# Patient Record
Sex: Female | Born: 1985 | Race: Black or African American | Hispanic: No | Marital: Married | Smoking: Never smoker
Health system: Southern US, Community
[De-identification: ages and names within clinical notes are randomized; demographics above are authoritative.]

## PROBLEM LIST (undated history)

## (undated) DIAGNOSIS — F419 Anxiety disorder, unspecified: Secondary | ICD-10-CM

## (undated) DIAGNOSIS — R7303 Prediabetes: Secondary | ICD-10-CM

## (undated) DIAGNOSIS — G4730 Sleep apnea, unspecified: Secondary | ICD-10-CM

## (undated) DIAGNOSIS — R5383 Other fatigue: Secondary | ICD-10-CM

## (undated) DIAGNOSIS — J189 Pneumonia, unspecified organism: Secondary | ICD-10-CM

## (undated) DIAGNOSIS — I10 Essential (primary) hypertension: Secondary | ICD-10-CM

## (undated) DIAGNOSIS — L509 Urticaria, unspecified: Secondary | ICD-10-CM

## (undated) DIAGNOSIS — R0602 Shortness of breath: Secondary | ICD-10-CM

## (undated) DIAGNOSIS — F32A Depression, unspecified: Secondary | ICD-10-CM

## (undated) HISTORY — DX: Depression, unspecified: F32.A

## (undated) HISTORY — DX: Urticaria, unspecified: L50.9

## (undated) HISTORY — DX: Prediabetes: R73.03

## (undated) HISTORY — DX: Other fatigue: R53.83

## (undated) HISTORY — DX: Anxiety disorder, unspecified: F41.9

## (undated) HISTORY — DX: Shortness of breath: R06.02

## (undated) HISTORY — DX: Sleep apnea, unspecified: G47.30

---

## 2007-10-22 ENCOUNTER — Emergency Department (HOSPITAL_COMMUNITY): Admission: EM | Admit: 2007-10-22 | Discharge: 2007-10-23 | Payer: Self-pay | Admitting: Emergency Medicine

## 2007-11-15 ENCOUNTER — Emergency Department (HOSPITAL_COMMUNITY): Admission: EM | Admit: 2007-11-15 | Discharge: 2007-11-15 | Payer: Self-pay | Admitting: Emergency Medicine

## 2008-04-11 ENCOUNTER — Emergency Department (HOSPITAL_COMMUNITY): Admission: EM | Admit: 2008-04-11 | Discharge: 2008-04-11 | Payer: Self-pay | Admitting: Emergency Medicine

## 2008-04-12 ENCOUNTER — Ambulatory Visit (HOSPITAL_COMMUNITY): Admission: RE | Admit: 2008-04-12 | Discharge: 2008-04-12 | Payer: Self-pay | Admitting: Obstetrics

## 2008-04-24 ENCOUNTER — Ambulatory Visit (HOSPITAL_COMMUNITY): Admission: RE | Admit: 2008-04-24 | Discharge: 2008-04-24 | Payer: Self-pay | Admitting: Obstetrics

## 2008-04-29 ENCOUNTER — Inpatient Hospital Stay (HOSPITAL_COMMUNITY): Admission: AD | Admit: 2008-04-29 | Discharge: 2008-05-01 | Payer: Self-pay | Admitting: Obstetrics & Gynecology

## 2008-06-25 ENCOUNTER — Ambulatory Visit (HOSPITAL_COMMUNITY): Admission: RE | Admit: 2008-06-25 | Discharge: 2008-06-25 | Payer: Self-pay | Admitting: Obstetrics

## 2008-09-06 ENCOUNTER — Inpatient Hospital Stay (HOSPITAL_COMMUNITY): Admission: AD | Admit: 2008-09-06 | Discharge: 2008-09-08 | Payer: Self-pay | Admitting: Obstetrics

## 2011-03-02 NOTE — H&P (Signed)
NAMEMosie Ponce             ACCOUNT NO.:  [id number]   MEDICAL RECORD NO.:  [id number]          PATIENT TYPE:  INP   LOCATION:  9163                          [facility]ILITY:  [facility]   PHYSICIAN:  Roseanna Rainbow, M.D.DATE OF BIRTH:  May 26, 1986   DATE OF ADMISSION:  09/06/2008  DATE OF DISCHARGE:                              HISTORY & PHYSICAL   CHIEF COMPLAINT:  The patient is a 25 year old para 0 with an estimated  date of confinement of September 07, 2008, with an intrauterine pregnancy  at 39+ weeks with elevated blood pressure.   HISTORY OF PRESENT ILLNESS:  The patient had presented to the office  earlier in the week and was found to have elevated blood pressures in  the 140s over 80s-90s.  She denies any neurological complaints.   ALLERGIES:  No known drug allergies.   MEDICATIONS:  Please see the medication reconciliation form.   PRENATAL LABORATORY DATA:  Chlamydia probe is negative.  Urine culture  and sensitivity no uropathogens.  GC probe negative.  One-hour GTT 101.  Hepatitis B surface antigen negative, hematocrit 32.1, hemoglobin 10.6,  HIV nonreactive, platelets 350,000.  Blood type B+, antibody screen  negative, RPR nonreactive, and rubella immune.  Sickle cell negative.   PAST GYNECOLOGICAL HISTORY:  Abnormal Pap smear status post colposcopy.   PAST MEDICAL HISTORY:  No significant history of medical diseases.   PAST SURGICAL HISTORY:  No previous surgery.   SOCIAL HISTORY:  She is a Consulting civil engineer.  She is single, has no significant  smoking history.  Denies illicit drug use.   FAMILY HISTORY:  Remarkable for chronic hypertension, adult-onset  diabetes, and CVA.   PHYSICAL EXAMINATION:  VITAL SIGNS:  Stable, afebrile.  Blood pressures  is 128/60.  Fetal heart tracing reassuring.  Tocodynamometer rare  uterine contractions.  GENERAL:  Overweight African American female in no apparent distress.  ABDOMEN:  Gravid.  Sterile vaginal exam.   LABORATORY DATA:  A  PIH panel in September 05, 2008, was unremarkable.   ASSESSMENT:  Primigravida at term with rule out gestational hypertension  with fetal heart tracing consistent with fetal well-being. borderline  Bishop score.   PLAN:  Admission, 2-stage induction of labor.  Monitor for signs and  symptoms of worsening preeclampsia.      Roseanna Rainbow, M.D.  Electronically Signed     LAJ/MEDQ  D:  09/06/2008  T:  09/07/2008  Job:  [id number]

## 2011-03-02 NOTE — H&P (Signed)
NAMEMosie Ponce             ACCOUNT NO.:  [id number]   MEDICAL RECORD NO.:  [id number]          PATIENT TYPE:  INP   LOCATION:  9319                          [facility]ILITY[facility]  [facility]   PHYSICIAN:  Roseanna Rainbow, M.D.DATE OF BIRTH:  10-12-1986   DATE OF ADMISSION:  04/29/2008  DATE OF DISCHARGE:  05/01/2008                              HISTORY & PHYSICAL   CHIEF COMPLAINT:  The patient is a 25 year old with complaints of nausea  and vomiting.   HISTORY OF PRESENT ILLNESS:  The patient had been treated previously  with Phenergan for similar complaints.  She reports being unable to  tolerate fluids.   PAST MEDICAL HISTORY:  Noncontributory.   PAST SURGICAL HISTORY:  She denies.   SOCIAL HISTORY:  No tobacco, ethanol or drug use.   REVIEW OF SYSTEMS:  GI:  Please see the above.   MEDICATIONS:  Please see the medication reconciliation form.   ALLERGIES:  No known drug allergies.   PHYSICAL EXAMINATION:  VITAL SIGNS:  Stable, afebrile.  ABDOMEN:  Nontender.  PELVIC:  Deferred.  Fetal heart tones by Doppler 160s.   LABS:  Urinalysis, specific gravity greater than 1.030, cloudy, small  bilirubin, 40 ketones, 30 protein, potassium 3.3, white blood cell count  5.8, hemoglobin 10.4, and hematocrit 30.1.   ASSESSMENT:  Intrauterine pregnancy at 21 weeks with pregnancy related  nausea, vomiting, mild-to-moderate dehydration and mild hypokalemia.   PLAN:  Admission, IV hydration, and antiemetics.      Roseanna Rainbow, M.D.  Electronically Signed     LAJ/MEDQ  D:  05/01/2008  T:  05/01/2008  Job:  [id number]

## 2011-03-05 NOTE — Discharge Summary (Signed)
NAMEMosie Epstein             ACCOUNT NO.:  [id number]   MEDICAL RECORD NO.:  [id number]          PATIENT TYPE:  INP   LOCATION:  9319                          [facility]  [facility]   PHYSICIAN:  Roseanna Rainbow, M.D.DATE OF BIRTH:  1986/01/27   DATE OF ADMISSION:  04/29/2008  DATE OF DISCHARGE:  05/01/2008                               DISCHARGE SUMMARY   CHIEF COMPLAINT:  The patient is a 25 year old with an intrauterine  pregnancy at 21+ weeks complaining of nausea and vomiting.  Please see  the dictated history and physical.   HISTORY OF PRESENT ILLNESS:  The patient was admitted and started on  antiemetics.  She was also hydrated with intravenous crystalloid.  She  was started on a BRATT diet and this was tolerated and her nausea  improved.  She was discharged to home on May 01, 2008.   DISCHARGE DIAGNOSES:  Pregnancy related nausea and vomiting.   CONDITION:  Improved.   DIET:  BRATT diet.   MEDICATIONS:  Zofran as needed.   ACTIVITY:  Ad lib.   DISPOSITION:  The patient was to follow up in the office in 1 week.      Roseanna Rainbow, M.D.  Electronically Signed     LAJ/MEDQ  D:  05/31/2008  T:  06/01/2008  Job:  [id number]

## 2011-07-08 LAB — URINALYSIS, ROUTINE W REFLEX MICROSCOPIC
Bilirubin Urine: NEGATIVE
Glucose, UA: NEGATIVE
Hgb urine dipstick: NEGATIVE
Specific Gravity, Urine: 1.031 — ABNORMAL HIGH
pH: 7

## 2011-07-08 LAB — BASIC METABOLIC PANEL
Calcium: 9.1
GFR calc non Af Amer: >60
Potassium: 3.5
Sodium: 138

## 2011-07-08 LAB — DIFFERENTIAL
Eosinophils Relative: 3
Lymphocytes Relative: 16
Lymphs Abs: 2.3
Monocytes Absolute: 0.8
Neutro Abs: 10.1 — ABNORMAL HIGH

## 2011-07-08 LAB — HEPATIC FUNCTION PANEL
ALT: 13
AST: 13
Alkaline Phosphatase: 67
Total Protein: 7.6

## 2011-07-08 LAB — CBC
HCT: 35.1 — ABNORMAL LOW
Hemoglobin: 11.8 — ABNORMAL LOW
Platelets: 426 — ABNORMAL HIGH
WBC: 13.8 — ABNORMAL HIGH

## 2011-07-08 LAB — URINE MICROSCOPIC-ADD ON

## 2011-07-15 LAB — CBC
HCT: 30.1 — ABNORMAL LOW
Platelets: 278
RBC: 3.41 — ABNORMAL LOW

## 2011-07-15 LAB — URINALYSIS, ROUTINE W REFLEX MICROSCOPIC
Bilirubin Urine: NEGATIVE
Glucose, UA: NEGATIVE
Hgb urine dipstick: NEGATIVE
Hgb urine dipstick: NEGATIVE
Leukocytes, UA: NEGATIVE
Nitrite: NEGATIVE
Protein, ur: 30 — AB
Specific Gravity, Urine: 1.027
Specific Gravity, Urine: >1.03 — ABNORMAL HIGH
Urobilinogen, UA: 1
Urobilinogen, UA: 1

## 2011-07-15 LAB — COMPREHENSIVE METABOLIC PANEL
Albumin: 2.8 — ABNORMAL LOW
Alkaline Phosphatase: 40
BUN: 3 — ABNORMAL LOW
Chloride: 106
Glucose, Bld: 85
Potassium: 3.3 — ABNORMAL LOW
Total Bilirubin: 0.7

## 2011-07-15 LAB — URINE MICROSCOPIC-ADD ON

## 2011-07-20 LAB — CBC
HCT: 25.4 — ABNORMAL LOW
MCHC: 33.7
Platelets: 251
Platelets: 298
RBC: 3.54 — ABNORMAL LOW
WBC: 10.2

## 2011-07-20 LAB — RPR: RPR Ser Ql: NONREACTIVE

## 2012-09-09 ENCOUNTER — Emergency Department (HOSPITAL_COMMUNITY)
Admission: EM | Admit: 2012-09-09 | Discharge: 2012-09-09 | Disposition: A | Discharge status: Home / Self Care | Payer: Medicaid Other | Source: Home / Self Care | Attending: Family Medicine | Admitting: Family Medicine

## 2012-09-09 ENCOUNTER — Encounter (HOSPITAL_COMMUNITY): Payer: Self-pay

## 2012-09-09 DIAGNOSIS — R51 Headache: Secondary | ICD-10-CM

## 2012-09-09 DIAGNOSIS — J309 Allergic rhinitis, unspecified: Secondary | ICD-10-CM

## 2012-09-09 HISTORY — DX: Essential (primary) hypertension: I10

## 2012-09-09 LAB — POCT URINALYSIS DIP (DEVICE)
Glucose, UA: NEGATIVE mg/dL
Hgb urine dipstick: NEGATIVE
Protein, ur: NEGATIVE mg/dL
Specific Gravity, Urine: 1.02 (ref 1.005–1.030)
Urobilinogen, UA: 1 mg/dL (ref 0.0–1.0)
pH: 7 (ref 5.0–8.0)

## 2012-09-09 LAB — POCT PREGNANCY, URINE: Preg Test, Ur: NEGATIVE

## 2012-09-09 MED ORDER — TRAMADOL HCL 50 MG PO TABS: 50.0000 mg | ORAL_TABLET | Freq: Four times a day (QID) | ORAL | Status: DC | PRN

## 2012-09-09 MED ORDER — PREDNISONE 20 MG PO TABS: ORAL_TABLET | ORAL | Status: DC

## 2012-09-09 MED ORDER — CETIRIZINE-PSEUDOEPHEDRINE ER 5-120 MG PO TB12: 1.0000 | ORAL_TABLET | Freq: Two times a day (BID) | ORAL | Status: DC

## 2012-09-09 MED ORDER — IBUPROFEN 600 MG PO TABS: 600.0000 mg | ORAL_TABLET | Freq: Three times a day (TID) | ORAL | Status: DC | PRN

## 2012-09-09 NOTE — ED Notes (Signed)
States she has been having HA off and on forpast few days, using OTC medications w minimal relief; NAD at present

## 2012-09-12 NOTE — ED Provider Notes (Signed)
History     CSN: [id number]  Arrival date & time 09/09/12  1440   First MD Initiated Contact with Patient 09/09/12 1559      Chief Complaint  Patient presents with  . Headache    (Consider location/radiation/quality/duration/timing/severity/associated sxs/prior treatment) HPI Comments: 26 year old female with history of morbid obesity hypertension. Not on current medications. Here complaining of intermittent headaches for the last 3 days. Denies visual changes. Denies balance problems. Patient reports some nasal congestion and clear rhinorrhea. Denies productive cough shortness of breath or chest pain. No balance or gait problems. No extremity weakness numbness or paresthesias. No nausea or vomiting. She is sexually active and reports taking birth control pills. Denies dysuria, hematuria, fever or chills. No abdominal pain.   Past Medical History  Diagnosis Date  . Hypertension     History reviewed. No pertinent past surgical history.  History reviewed. No pertinent family history.  History  Substance Use Topics  . Smoking status: Never Smoker   . Smokeless tobacco: Not on file  . Alcohol Use: Yes    OB History    Grav Para Term Preterm Abortions TAB SAB Ect Mult Living                  Review of Systems  Constitutional: Negative for fever, chills, diaphoresis, activity change, appetite change, fatigue and unexpected weight change.  HENT: Positive for congestion, rhinorrhea, sneezing and sinus pressure. Negative for ear pain, sore throat, neck pain, neck stiffness, tinnitus and ear discharge.   Eyes: Negative for photophobia, pain, redness and visual disturbance.  Cardiovascular: Negative for chest pain, palpitations and leg swelling.  Gastrointestinal: Negative for nausea, vomiting, abdominal pain and diarrhea.  Genitourinary: Negative for dysuria, frequency, hematuria, flank pain, vaginal bleeding, vaginal discharge, vaginal pain and pelvic pain.  Musculoskeletal:  Negative for gait problem.  Neurological: Positive for headaches. Negative for dizziness, tremors, seizures, syncope, speech difficulty, weakness and numbness.    Allergies  Review of patient's allergies indicates no known allergies.  Home Medications   Current Outpatient Rx  Name  Route  Sig  Dispense  Refill  . CETIRIZINE-PSEUDOEPHEDRINE ER 5-120 MG PO TB12   Oral   Take 1 tablet by mouth 2 (two) times daily.   20 tablet   0   . IBUPROFEN 600 MG PO TABS   Oral   Take 1 tablet (600 mg total) by mouth every 8 (eight) hours as needed for pain.   20 tablet   0   . PREDNISONE 20 MG PO TABS      2 tablets by mouth daily for 5 days   10 tablet   0   . TRAMADOL HCL 50 MG PO TABS   Oral   Take 1 tablet (50 mg total) by mouth every 6 (six) hours as needed for pain.   15 tablet   0     BP 141/77  Pulse 107  Temp 98.5 F (36.9 C) (Oral)  Resp 14  SpO2 100%  LMP 09/03/2012  Physical Exam  Nursing note and vitals reviewed. Constitutional: She is oriented to person, place, and time. She appears well-developed and well-nourished. No distress.  HENT:  Head: Normocephalic and atraumatic.       Nasal Congestion with erythema and swelling of nasal turbinates, clear rhinorrhea. Significant pharyngeal erythema enlarged tonsils bilaterally no exudates. No uvula deviation. No trismus. TM's with increased vascular markings impress clear fluid behind TMs, no dullness bilaterally no swelling or bulging   Eyes: Conjunctivae  normal and EOM are normal. Pupils are equal, round, and reactive to light. Right eye exhibits no discharge. Left eye exhibits no discharge. No scleral icterus.  Neck: Neck supple. No thyromegaly present.       Obese  Cardiovascular: Normal rate, regular rhythm and normal heart sounds.  Exam reveals no gallop and no friction rub.   No murmur heard. Pulmonary/Chest: Effort normal and breath sounds normal. No respiratory distress. She has no wheezes. She has no rales.  She exhibits no tenderness.  Abdominal: Soft. Bowel sounds are normal. She exhibits no distension and no mass. There is no tenderness. There is no rebound and no guarding.  Lymphadenopathy:    She has cervical adenopathy.  Neurological: She is alert and oriented to person, place, and time.  Skin: No rash noted. She is not diaphoretic.    ED Course  Procedures (including critical care time)   Labs Reviewed  POCT INFECTIOUS MONO SCREEN  POCT RAPID STREP A ([facility] CARE ONLY)  POCT URINALYSIS DIP (DEVICE)  GLUCOSE, CAPILLARY  POCT PREGNANCY, URINE  LAB REPORT - SCANNED   No results found.   1. Headache   2. Allergic rhinitis       MDM  Normal urine test.  pregnancy test negative. Negative strep and mono test. Impress allergic rhinitis with postnasal drip likely irritating tonsils tonsils, middle ear clear effusion, no fever or exudates does not impress infectious. Blood pressure normal here. Prescribed Zyrtec-D. Prednisone and tramadol. Asked to followup with primary care provider to monitor her symptoms. Supportive care and red flags that should prompt her return to medical attention discussed with patient and provided in writing.        Sharin Grave, MD 09/12/12 1444

## 2013-04-03 ENCOUNTER — Other Ambulatory Visit: Payer: Self-pay | Admitting: Family Medicine

## 2013-04-03 ENCOUNTER — Other Ambulatory Visit (HOSPITAL_COMMUNITY)
Admission: RE | Admit: 2013-04-03 | Discharge: 2013-04-03 | Disposition: A | Discharge status: Home / Self Care | Payer: Medicaid Other | Source: Ambulatory Visit | Attending: Family Medicine | Admitting: Family Medicine

## 2013-04-03 DIAGNOSIS — N760 Acute vaginitis: Secondary | ICD-10-CM | POA: Insufficient documentation

## 2013-04-03 DIAGNOSIS — Z113 Encounter for screening for infections with a predominantly sexual mode of transmission: Secondary | ICD-10-CM | POA: Insufficient documentation

## 2014-07-08 ENCOUNTER — Ambulatory Visit: Payer: Medicaid Other | Admitting: Dietician

## 2014-08-08 ENCOUNTER — Ambulatory Visit: Payer: Medicaid Other | Admitting: Dietician

## 2014-11-27 ENCOUNTER — Other Ambulatory Visit (INDEPENDENT_AMBULATORY_CARE_PROVIDER_SITE_OTHER): Payer: Self-pay | Admitting: General Surgery

## 2014-11-27 DIAGNOSIS — G4733 Obstructive sleep apnea (adult) (pediatric): Secondary | ICD-10-CM

## 2014-12-17 ENCOUNTER — Ambulatory Visit (HOSPITAL_COMMUNITY)
Admission: RE | Admit: 2014-12-17 | Discharge: 2014-12-17 | Disposition: A | Discharge status: Home / Self Care | Payer: 59 | Source: Ambulatory Visit | Attending: General Surgery | Admitting: General Surgery

## 2014-12-17 ENCOUNTER — Other Ambulatory Visit: Payer: Self-pay

## 2014-12-17 ENCOUNTER — Encounter (HOSPITAL_COMMUNITY)
Admission: RE | Disposition: A | Discharge status: Home / Self Care | Payer: Self-pay | Source: Ambulatory Visit | Attending: General Surgery

## 2014-12-17 DIAGNOSIS — R05 Cough: Secondary | ICD-10-CM | POA: Diagnosis not present

## 2014-12-17 DIAGNOSIS — G4733 Obstructive sleep apnea (adult) (pediatric): Secondary | ICD-10-CM

## 2014-12-17 HISTORY — PX: BREATH TEK H PYLORI: SHX5422

## 2014-12-17 SURGERY — BREATH TEST, FOR HELICOBACTER PYLORI

## 2014-12-17 NOTE — Progress Notes (Signed)
   12/17/14 1026  BREATH TEK ASSESSMENT  Referring MD Dr Gaynelle AduEric Wilson  Time of Last PO Intake 2200  Baseline Breath At: 0729  Pranactin Given At: 0749  Post-Dose Breath At: 0744  Sample 1 3.7%  Sample 2 2.7%  Test Negative

## 2014-12-18 ENCOUNTER — Encounter (HOSPITAL_COMMUNITY): Payer: Self-pay | Admitting: General Surgery

## 2014-12-21 ENCOUNTER — Encounter: Payer: Self-pay | Admitting: Dietician

## 2014-12-21 ENCOUNTER — Encounter: Payer: 59 | Attending: General Surgery | Admitting: Dietician

## 2014-12-21 DIAGNOSIS — Z6841 Body Mass Index (BMI) 40.0 and over, adult: Secondary | ICD-10-CM | POA: Diagnosis not present

## 2014-12-21 DIAGNOSIS — Z713 Dietary counseling and surveillance: Secondary | ICD-10-CM | POA: Diagnosis not present

## 2014-12-21 NOTE — Patient Instructions (Signed)
Follow Pre-Op Goals Try Protein Shakes Call [facility] at [phone] when surgery is scheduled to enroll in Pre-Op Class  Things to remember:  Please always be honest with us. We want to support you!  If you have any questions or concerns in between appointments, please call or email Liz, Dayona Shaheen, or Laurie.  The diet after surgery will be high protein and low in carbohydrate.  Vitamins and calcium need to be taken for the rest of your life.  Feel free to include support people in any classes or appointments.  

## 2014-12-21 NOTE — Progress Notes (Signed)
  Pre-Op Assessment Visit:  Pre-Operative Gastric sleeve Surgery  Medical Nutrition Therapy:  Appt start time: 0805   End time:  0840  Patient was seen on 12/21/2014 for Pre-Operative Nutrition Assessment. [facility] coordinator on 12/21/2014.   Preferred Learning Style:   No preference indicated   Learning Readiness:   Ready  Handouts given during visit include:  Pre-Op Goals Bariatric Surgery Protein Shakes   During the appointment today the following Pre-Op Goals were reviewed with the patient: Maintain or lose weight as instructed by your surgeon Make healthy food choices Begin to limit portion sizes Limited concentrated sugars and fried foods Keep fat/sugar in the single digits per serving on   food labels Practice CHEWING your food  (aim for 30 chews per bite or until applesauce consistency) Practice not drinking 15 minutes before, during, and 30 minutes after each meal/snack Avoid all carbonated beverages  Avoid/limit caffeinated beverages  Avoid all sugar-sweetened beverages Consume 3 meals per day; eat every 3-5 hours Make a list of non-food related activities Aim for 64-100 ounces of FLUID daily  Aim for at least 60-80 grams of PROTEIN daily Look for a liquid protein source that contain ?15 g protein and ?5 g carbohydrate  (ex: shakes, drinks, shots)  Patient-Centered Goals: -More energy -Travel with daughter  Scale of 1-10: confidence (6) / importance scale (10)  Demonstrated degree of understanding via:  Teach Back  Teaching Method Utilized:  Visual Auditory Hands on  Barriers to learning/adherence to lifestyle change: none  Patient to call the [facility] to enroll in Pre-Op and Post-Op Nutrition Education when surgery date is scheduled.

## 2014-12-31 ENCOUNTER — Other Ambulatory Visit (HOSPITAL_COMMUNITY)
Admission: RE | Admit: 2014-12-31 | Discharge: 2014-12-31 | Disposition: A | Discharge status: Home / Self Care | Payer: 59 | Source: Ambulatory Visit | Attending: Obstetrics and Gynecology | Admitting: Obstetrics and Gynecology

## 2014-12-31 ENCOUNTER — Other Ambulatory Visit: Payer: Self-pay | Admitting: Nurse Practitioner

## 2014-12-31 DIAGNOSIS — Z01419 Encounter for gynecological examination (general) (routine) without abnormal findings: Secondary | ICD-10-CM | POA: Diagnosis not present

## 2014-12-31 DIAGNOSIS — Z113 Encounter for screening for infections with a predominantly sexual mode of transmission: Secondary | ICD-10-CM | POA: Diagnosis present

## 2015-01-01 LAB — CYTOLOGY - PAP

## 2015-01-11 ENCOUNTER — Encounter (HOSPITAL_COMMUNITY): Payer: Self-pay | Admitting: *Deleted

## 2015-01-11 ENCOUNTER — Emergency Department (INDEPENDENT_AMBULATORY_CARE_PROVIDER_SITE_OTHER)
Admission: EM | Admit: 2015-01-11 | Discharge: 2015-01-11 | Disposition: A | Discharge status: Home / Self Care | Payer: 59 | Source: Home / Self Care | Attending: Family Medicine | Admitting: Family Medicine

## 2015-01-11 DIAGNOSIS — L739 Follicular disorder, unspecified: Secondary | ICD-10-CM | POA: Diagnosis not present

## 2015-01-11 MED ORDER — DOXYCYCLINE HYCLATE 100 MG PO CAPS: 100.0000 mg | ORAL_CAPSULE | Freq: Two times a day (BID) | ORAL | Status: DC

## 2015-01-11 NOTE — ED Provider Notes (Signed)
CSN: [id number]     Arrival date & time 01/11/15  1714 History   First MD Initiated Contact with Patient 01/11/15 1741     Chief Complaint  Patient presents with  . Neck Pain   (Consider location/radiation/quality/duration/timing/severity/associated sxs/prior Treatment) Patient is a 29 y.o. female presenting with neck pain. The history is provided by the patient.  Neck Pain Pain location:  Occipital region Quality:  Stabbing Pain radiates to:  Does not radiate Pain severity:  Mild Onset quality:  Gradual Duration:  3 weeks Progression:  Worsening (over past 2 days) Chronicity:  New Relieved by:  None tried Worsened by:  Nothing tried Associated symptoms: no fever, no numbness and no tingling     Past Medical History  Diagnosis Date  . Hypertension   . Sleep apnea    Past Surgical History  Procedure Laterality Date  . Breath tek h pylori N/A 12/17/2014    Procedure: BREATH TEK H PYLORI;  Surgeon: Atilano InaEric M Wilson, MD;  [facility];  Service: General;  Laterality: N/A;   History reviewed. No pertinent family history. History  Substance Use Topics  . Smoking status: Never Smoker   . Smokeless tobacco: Not on file  . Alcohol Use: Yes   OB History    No data available     Review of Systems  Constitutional: Negative.  Negative for fever.  HENT: Negative.   Musculoskeletal: Positive for neck pain. Negative for neck stiffness.  Neurological: Negative for tingling and numbness.    Allergies  Review of patient's allergies indicates no known allergies.  Home Medications   Prior to Admission medications   Medication Sig Start Date End Date Taking? Authorizing Provider  cetirizine-pseudoephedrine (ZYRTEC-D) 5-120 MG per tablet Take 1 tablet by mouth 2 (two) times daily. Patient not taking: Reported on 12/21/2014 09/09/12   Christin FudgeAdlih Moreno-Coll, MD  doxycycline (VIBRAMYCIN) 100 MG capsule Take 1 capsule (100 mg total) by mouth 2 (two) times daily. 01/11/15   Linna HoffJames D  Kindl, MD  ibuprofen (ADVIL,MOTRIN) 600 MG tablet Take 1 tablet (600 mg total) by mouth every 8 (eight) hours as needed for pain. Patient not taking: Reported on 12/21/2014 09/09/12   Christin FudgeAdlih Moreno-Coll, MD  norethindrone-ethinyl estradiol (JUNEL FE,GILDESS FE,LOESTRIN FE) 1-20 MG-MCG tablet Take 1 tablet by mouth daily.    Historical Provider, MD  predniSONE (DELTASONE) 20 MG tablet 2 tablets by mouth daily for 5 days Patient not taking: Reported on 12/21/2014 09/09/12   Christin FudgeAdlih Moreno-Coll, MD  traMADol (ULTRAM) 50 MG tablet Take 1 tablet (50 mg total) by mouth every 6 (six) hours as needed for pain. Patient not taking: Reported on 12/21/2014 09/09/12   Adlih Moreno-Coll, MD   BP 111/81 mmHg  Pulse 108  Temp(Src) 98.5 F (36.9 C) (Oral)  Resp 16  SpO2 96%  LMP 11/18/2014 (Approximate) Physical Exam  Constitutional: She is oriented to person, place, and time. She appears well-developed and well-nourished.  Neck: Normal range of motion. Neck supple.  Musculoskeletal: She exhibits tenderness.  Lymphadenopathy:    She has no cervical adenopathy.  Neurological: She is alert and oriented to person, place, and time.  Skin: Skin is warm and dry.  Tender midline midoccipital neck cystic nodule, no erythema or drainage.  Nursing note and vitals reviewed.   ED Course  Procedures (including critical care time) Labs Review Labs Reviewed - No data to display  Imaging Review No results found.   MDM   1. Acute folliculitis  Linna Hoff, MD 01/11/15 [phone]

## 2015-01-11 NOTE — ED Notes (Signed)
Pt  Noticed   Some  Swelling    To  Back of  Neck  sev  Weeks  Ago  She  Reports  It  Got  painfull  2  Days  Ago     it  Is  Tender to the  Touch  -  She  denys  Any  Known  specefic injury

## 2015-01-11 NOTE — Discharge Instructions (Signed)
Warm compress twice a day when you take the antibiotic, take all of medicine, return as needed. °

## 2015-01-20 ENCOUNTER — Encounter: Payer: 59 | Attending: General Surgery | Admitting: Dietician

## 2015-01-20 DIAGNOSIS — Z713 Dietary counseling and surveillance: Secondary | ICD-10-CM | POA: Insufficient documentation

## 2015-01-20 DIAGNOSIS — Z6841 Body Mass Index (BMI) 40.0 and over, adult: Secondary | ICD-10-CM | POA: Diagnosis not present

## 2015-01-20 NOTE — Progress Notes (Signed)
  Supervised Weight Loss:  Appt start time: 440 end time:  455  SWL visit 1:  Primary concerns today: Lydia returns having gained 5 pounds since her assessment 1 month ago. She reports struggling to eliminate caffeine. Has cut out sodas but still having some tea. Having water with Mio drops. Has been out of town lately and had to eat out more. Has recently started eating breakfast regularly.  Weight: 316.1 lbs BMI: 59.9  Goals: -Keep working on reducing caffeine intake -Work on being mindful of bariatric-friendly options at [employer] food restaurants  MEDICATIONS: see list  DIETARY INTAKE:  24-hr recall:  B ( AM): bacon and scrambled eggs  Snk ( AM) :  L ( PM): normally out: burger and fries  Snk ( PM):  D ( PM): fish and shrimp, french fries  Snk ( PM):   Beverages: tea, water with flavoring  Recent physical activity: did not assess today  Estimated energy needs: 1600-1800 calories  Progress Towards Goal(s):  In progress.   Nutritional Diagnosis:  [place]-3.3 Overweight/obesity related to past poor dietary habits and physical inactivity as evidenced by patient in SWL for pending bariatric surgery following dietary guidelines for continued weight loss.     Intervention:  Nutrition counseling provided.  Handouts given during visit include:  Bariatric Fast Food Guide  Monitoring/Evaluation:  Dietary intake, exercise, and body weight in 4 week(s).

## 2015-02-22 ENCOUNTER — Encounter: Payer: Self-pay | Admitting: Dietician

## 2015-02-22 ENCOUNTER — Encounter: Payer: 59 | Attending: General Surgery | Admitting: Dietician

## 2015-02-22 DIAGNOSIS — Z6841 Body Mass Index (BMI) 40.0 and over, adult: Secondary | ICD-10-CM | POA: Diagnosis not present

## 2015-02-22 DIAGNOSIS — Z713 Dietary counseling and surveillance: Secondary | ICD-10-CM | POA: Insufficient documentation

## 2015-02-22 NOTE — Progress Notes (Signed)
  Supervised Weight Loss:  Appt start time: 800 end time:  815  SWL visit 2:  Primary concerns today: Laura Ponce returns having lost 12 pounds. She states that she has started drinking only water, rarely tea. She has succeeded at eliminating caffeine. Laura Ponce is also trying to eat 3 meals a day. She has been using Bariatric Fast Food Guide handout to practice being mindful of bariatric-friendly choices. Also working on stopping when she's full. Still struggling with cutting back on carbs.   Weight: 303.9 lbs BMI: 57.5  Goals: -Practice chewing thoroughly, taking small bites, and eating slowly -Keep working on being mindful of bariatric-friendly options at fast food restaurants  MEDICATIONS: see list  DIETARY INTAKE:  24-hr recall:  B ( AM): bacon and scrambled eggs  Snk ( AM) :  L ( PM): normally out: burger and fries  Snk ( PM):  D ( PM): fish and shrimp, french fries  Snk ( PM):   Beverages: tea, water with flavoring  Recent physical activity: did not assess today  Estimated energy needs: 1600-1800 calories  Progress Towards Goal(s):  In progress.   Nutritional Diagnosis:  [place]-3.3 Overweight/obesity related to past poor dietary habits and physical inactivity as evidenced by patient in SWL for pending bariatric surgery following dietary guidelines for continued weight loss.     Intervention:  Nutrition counseling provided.   Monitoring/Evaluation:  Dietary intake, exercise, and body weight in 4 week(s).

## 2015-03-22 ENCOUNTER — Encounter: Payer: 59 | Attending: General Surgery | Admitting: Dietician

## 2015-03-22 ENCOUNTER — Encounter: Payer: Self-pay | Admitting: Dietician

## 2015-03-22 DIAGNOSIS — Z713 Dietary counseling and surveillance: Secondary | ICD-10-CM | POA: Insufficient documentation

## 2015-03-22 DIAGNOSIS — Z6841 Body Mass Index (BMI) 40.0 and over, adult: Secondary | ICD-10-CM | POA: Diagnosis not present

## 2015-03-22 NOTE — Patient Instructions (Addendum)
Goals: -Practice chewing thoroughly, taking small bites, and eating slowly -Try Premier Protein  -Plan to exercise 2 x week (walking) for at least 30 minutes -Practice not drinking 15 minutes before eat, during meals, and up through 30 minutes after eating

## 2015-03-22 NOTE — Progress Notes (Signed)
  Supervised Weight Loss:  Appt start time: 900 end time:  915  SWL visit 3:  Primary concerns today: Maize returns having gained 3 lbs. She started doing an hcG drop diet and has been eating a lot of pasta and sweets. Will not be continuing this diet. Still working on finding a protein shake she likes. Is looking for a gym and not regularly exercising.   Weight: 306.0 lbs BMI: 57.8  Goals: -Practice chewing thoroughly, taking small bites, and eating slowly -Try Premier Protein  -Plan to exercise 2 x week (walking) for at least 30 minutes -Practice not drinking 15 minutes before eat, during meals, and up through 30 minutes after eating  MEDICATIONS: see list  DIETARY INTAKE:  24-hr recall:  B ( AM): bacon and scrambled eggs  Snk ( AM) :  L ( PM): normally out: burger and fries  Snk ( PM):  D ( PM): fish and shrimp, french fries  Snk ( PM):   Beverages: tea, water with flavoring  Recent physical activity: none  Estimated energy needs: 1600-1800 calories  Progress Towards Goal(s):  In progress.   Nutritional Diagnosis:  [place]-3.3 Overweight/obesity related to past poor dietary habits and physical inactivity as evidenced by patient in SWL for pending bariatric surgery following dietary guidelines for continued weight loss.     Intervention:  Nutrition counseling provided.   Monitoring/Evaluation:  Dietary intake, exercise, and body weight in 4 week(s).

## 2015-04-26 ENCOUNTER — Encounter: Payer: Self-pay | Admitting: Dietician

## 2015-04-26 ENCOUNTER — Encounter: Payer: 59 | Attending: General Surgery | Admitting: Dietician

## 2015-04-26 DIAGNOSIS — Z6841 Body Mass Index (BMI) 40.0 and over, adult: Secondary | ICD-10-CM | POA: Diagnosis not present

## 2015-04-26 DIAGNOSIS — Z713 Dietary counseling and surveillance: Secondary | ICD-10-CM | POA: Insufficient documentation

## 2015-04-26 NOTE — Patient Instructions (Addendum)
Goals: -Practice chewing thoroughly, taking small bites, and eating slowly -Plan to exercise 2 x week (walking) for at least 30 minutes -Practice not drinking 15 minutes before eat, during meals, and up through 30 minutes after eating -Work on avoiding fried foods  [email or web address]

## 2015-04-26 NOTE — Progress Notes (Signed)
  Supervised Weight Loss:  Appt start time: 805 end time:  820  SWL visit 4:  Primary concerns today: Laura Ponce returns having lost about a half a pound. She has been out of town on vacation for 2 weeks and has not been eating well. However, she has been working on not drinking while she is eating. She finds herself getting "choked" sometimes. She signed up for a gym membership at [employer] but has not been. She tried chocolate Premier protein shake and did not like it. She has decided to [facility] She finds herself having stomach cramps and having to use the bathroom after eating; she suspects this is due to increase in fried foods.  Weight: 305.5 lbs BMI: 57.8  Goals: -Practice chewing thoroughly, taking small bites, and eating slowly -Plan to exercise 2 x week (walking) for at least 30 minutes -Practice not drinking 15 minutes before eat, during meals, and up through 30 minutes after eating -Work on avoiding fried foods  MEDICATIONS: see list  DIETARY INTAKE:  24-hr recall:  B ( AM): bacon and scrambled eggs  Snk ( AM) :  L ( PM): normally out: burger and fries  Snk ( PM):  D ( PM): fish and shrimp, french fries  Snk ( PM):   Beverages: tea, water with flavoring  Recent physical activity: none  Estimated energy needs: 1600-1800 calories  Progress Towards Goal(s):  In progress.   Nutritional Diagnosis:  [place]-3.3 Overweight/obesity related to past poor dietary habits and physical inactivity as evidenced by patient in SWL for pending bariatric surgery following dietary guidelines for continued weight loss.     Intervention:  Nutrition counseling provided.   Monitoring/Evaluation:  Dietary intake, exercise, and body weight in 4 week(s).

## 2015-05-06 ENCOUNTER — Other Ambulatory Visit (HOSPITAL_COMMUNITY): Payer: Self-pay | Admitting: Nurse Practitioner

## 2015-05-06 DIAGNOSIS — R102 Pelvic and perineal pain: Secondary | ICD-10-CM

## 2015-05-09 ENCOUNTER — Ambulatory Visit (HOSPITAL_COMMUNITY): Payer: 59

## 2015-05-13 ENCOUNTER — Ambulatory Visit (HOSPITAL_COMMUNITY): Payer: 59

## 2015-05-16 ENCOUNTER — Ambulatory Visit (HOSPITAL_COMMUNITY): Payer: 59

## 2015-05-19 ENCOUNTER — Encounter: Payer: 59 | Attending: General Surgery | Admitting: Dietician

## 2015-05-19 DIAGNOSIS — Z6841 Body Mass Index (BMI) 40.0 and over, adult: Secondary | ICD-10-CM | POA: Diagnosis not present

## 2015-05-19 DIAGNOSIS — Z713 Dietary counseling and surveillance: Secondary | ICD-10-CM | POA: Insufficient documentation

## 2015-05-19 NOTE — Progress Notes (Signed)
  Supervised Weight Loss:  Appt start time: 200 end time:  215  SWL visit 5:  Primary concerns today: Laura Ponce returns having gained 2.5 lbs. She is feeling nervous about upcoming surgery. She got the pre op diet handout from a friend who just had surgery and tried the diet for a few days. Has been drinking chocolate Premier protein shake. Also working on not drinking while eating.   Weight: 308 lbs BMI: 58.3  Goals: -Practice chewing thoroughly, taking small bites, and eating slowly -Plan to exercise 2 x week (walking) for at least 30 minutes -Practice not drinking 15 minutes before eat, during meals, and up through 30 minutes after eating -Work on avoiding fried foods  MEDICATIONS: see list  DIETARY INTAKE:  24-hr recall:  B ( AM): bacon and scrambled eggs  Snk ( AM) :  L ( PM): normally out: burger and fries  Snk ( PM):  D ( PM): fish and shrimp, french fries  Snk ( PM):   Beverages: tea, water with flavoring  Recent physical activity: none  Estimated energy needs: 1600-1800 calories  Progress Towards Goal(s):  In progress.   Nutritional Diagnosis:  [place]-3.3 Overweight/obesity related to past poor dietary habits and physical inactivity as evidenced by patient in SWL for pending bariatric surgery following dietary guidelines for continued weight loss.     Intervention:  Nutrition counseling provided.   Monitoring/Evaluation:  Dietary intake, exercise, and body weight in 4 week(s).

## 2015-05-20 ENCOUNTER — Encounter: Payer: Self-pay | Admitting: Dietician

## 2015-05-24 ENCOUNTER — Ambulatory Visit: Payer: 59 | Admitting: Dietician

## 2015-06-02 NOTE — Progress Notes (Signed)
  Pre-Operative Nutrition Class:  Appt start time: 830   End time:  930.  Patient was seen on 06/02/2015 for Pre-Operative Bariatric Surgery Education at [facility].   Surgery date:  Surgery type: Sleeve Gastrectomy Start weight at [facility]: 311 lbs on 12/21/14 Weight today: 310.5 lbs  TANITA  BODY COMP RESULTS  06/02/15   BMI (kg/m^2) 58.7   Fat Mass (lbs) 178.5   Fat Free Mass (lbs) 132   Total Body Water (lbs) 96.5   Samples given per MNT protocol. Patient educated on appropriate usage: Premier protein shake (vanilla - qty 1) Lot #: [id number] Exp: 10/2015  Celebrate calcium chew (berry - qty 1) Lot #: [id number] Exp: 12/2016  Renee Pain Protein Powder (unflavored - qty 1) Lot #: [id number] Exp: 04/2016  The following the learning objectives were met by the patient during this course:  Identify Pre-Op Dietary Goals and will begin 2 weeks pre-operatively  Identify appropriate sources of fluids and proteins   State protein recommendations and appropriate sources pre and post-operatively  Identify Post-Operative Dietary Goals and will follow for 2 weeks post-operatively  Identify appropriate multivitamin and calcium sources  Describe the need for physical activity post-operatively and will follow MD recommendations  State when to call healthcare provider regarding medication questions or post-operative complications  Handouts given during class include:  Pre-Op Bariatric Surgery Diet Handout  Protein Shake Handout  Post-Op Bariatric Surgery Nutrition Handout  BELT Program Information Flyer  Support Group Information Flyer  [facility] Bariatric Supplements Price List  Follow-Up Plan: Patient will follow-up at [facility] 2 weeks post operatively for diet advancement per MD.

## 2015-06-06 ENCOUNTER — Ambulatory Visit: Payer: Self-pay | Admitting: General Surgery

## 2015-06-12 NOTE — Patient Instructions (Signed)
Laura Ponce  06/12/2015   Your procedure is scheduled RU:[id number] 06/17/2015   Report to [facility] Main  Entrance take [place]  elevators to 3rd floor to  Short Stay Center at   1035 AM.  Call this number if you have problems the morning of surgery [phone]   Remember: ONLY 1 PERSON MAY GO WITH YOU TO SHORT STAY TO GET  READY MORNING OF YOUR SURGERY.  Do not eat food or drink liquids :After Midnight.     Take these medicines the morning of surgery with A SIP OF WATER: none                               You may not have any metal on your body including hair pins and              piercings  Do not wear jewelry, make-up, lotions, powders or perfumes, deodorant             Do not wear nail polish.  Do not shave  48 hours prior to surgery.              Men may shave face and neck.   Do not bring valuables to the hospital. [facility] IS NOT             RESPONSIBLE   FOR VALUABLES.  Contacts, dentures or bridgework may not be worn into surgery.  Leave suitcase in the car. After surgery it may be brought to your room.     Patients discharged the day of surgery will not be allowed to drive home.  Name and phone number of your driver:  Special Instructions: N/A              Please read over the following fact sheets you were given: _____________________________________________________________________             [facility] - Preparing for Surgery Before surgery, you can play an important role.  Because skin is not sterile, your skin needs to be as free of germs as possible.  You can reduce the number of germs on your skin by washing with CHG (chlorahexidine gluconate) soap before surgery.  CHG is an antiseptic cleaner which kills germs and bonds with the skin to continue killing germs even after washing. Please DO NOT use if you have an allergy to CHG or antibacterial soaps.  If your skin becomes reddened/irritated stop using the CHG and inform your nurse when  you arrive at Short Stay. Do not shave (including legs and underarms) for at least 48 hours prior to the first CHG shower.  You may shave your face/neck. Please follow these instructions carefully:  1.  Shower with CHG Soap the night before surgery and the  morning of Surgery.  2.  If you choose to wash your hair, wash your hair first as usual with your  normal  shampoo.  3.  After you shampoo, rinse your hair and body thoroughly to remove the  shampoo.                           4.  Use CHG as you would any other liquid soap.  You can apply chg directly  to the skin and wash  Gently with a scrungie or clean washcloth.  5.  Apply the CHG Soap to your body ONLY FROM THE NECK DOWN.   Do not use on face/ open                           Wound or open sores. Avoid contact with eyes, ears mouth and genitals (private parts).                       Wash face,  Genitals (private parts) with your normal soap.             6.  Wash thoroughly, paying special attention to the area where your surgery  will be performed.  7.  Thoroughly rinse your body with warm water from the neck down.  8.  DO NOT shower/wash with your normal soap after using and rinsing off  the CHG Soap.                9.  Pat yourself dry with a clean towel.            10.  Wear clean pajamas.            11.  Place clean sheets on your bed the night of your first shower and do not  sleep with pets. Day of Surgery : Do not apply any lotions/deodorants the morning of surgery.  Please wear clean clothes to the hospital/surgery center.  FAILURE TO FOLLOW THESE INSTRUCTIONS MAY RESULT IN THE CANCELLATION OF YOUR SURGERY PATIENT SIGNATURE_________________________________  NURSE SIGNATURE__________________________________  ________________________________________________________________________   Adam Phenix  An incentive spirometer is a tool that can help keep your lungs clear and active. This tool measures  how well you are filling your lungs with each breath. Taking long deep breaths may help reverse or decrease the chance of developing breathing (pulmonary) problems (especially infection) following:  A long period of time when you are unable to move or be active. BEFORE THE PROCEDURE   If the spirometer includes an indicator to show your best effort, your nurse or respiratory therapist will set it to a desired goal.  If possible, sit up straight or lean slightly forward. Try not to slouch.  Hold the incentive spirometer in an upright position. INSTRUCTIONS FOR USE  1. Sit on the edge of your bed if possible, or sit up as far as you can in bed or on a chair. 2. Hold the incentive spirometer in an upright position. 3. Breathe out normally. 4. Place the mouthpiece in your mouth and seal your lips tightly around it. 5. Breathe in slowly and as deeply as possible, raising the piston or the ball toward the top of the column. 6. Hold your breath for 3-5 seconds or for as long as possible. Allow the piston or ball to fall to the bottom of the column. 7. Remove the mouthpiece from your mouth and breathe out normally. 8. Rest for a few seconds and repeat Steps 1 through 7 at least 10 times every 1-2 hours when you are awake. Take your time and take a few normal breaths between deep breaths. 9. The spirometer may include an indicator to show your best effort. Use the indicator as a goal to work toward during each repetition. 10. After each set of 10 deep breaths, practice coughing to be sure your lungs are clear. If you have an incision (the cut made at the time of surgery),  support your incision when coughing by placing a pillow or rolled up towels firmly against it. Once you are able to get out of bed, walk around indoors and cough well. You may stop using the incentive spirometer when instructed by your caregiver.  RISKS AND COMPLICATIONS  Take your time so you do not get dizzy or light-headed.  If  you are in pain, you may need to take or ask for pain medication before doing incentive spirometry. It is harder to take a deep breath if you are having pain. AFTER USE  Rest and breathe slowly and easily.  It can be helpful to keep track of a log of your progress. Your caregiver can provide you with a simple table to help with this. If you are using the spirometer at home, follow these instructions: [place] IF:   You are having difficultly using the spirometer.  You have trouble using the spirometer as often as instructed.  Your pain medication is not giving enough relief while using the spirometer.  You develop fever of 100.5 F (38.1 C) or higher. SEEK IMMEDIATE MEDICAL CARE IF:   You cough up bloody sputum that had not been present before.  You develop fever of 102 F (38.9 C) or greater.  You develop worsening pain at or near the incision site. MAKE SURE YOU:   Understand these instructions.  Will watch your condition.  Will get help right away if you are not doing well or get worse. Document Released: 02/14/2007 Document Revised: 12/27/2011 Document Reviewed: 04/17/2007 [facility] Patient Information 2014 [place], [place].   ________________________________________________________________________

## 2015-06-13 ENCOUNTER — Ambulatory Visit: Payer: Self-pay | Admitting: General Surgery

## 2015-06-13 ENCOUNTER — Encounter (HOSPITAL_COMMUNITY)
Admission: RE | Admit: 2015-06-13 | Discharge: 2015-06-13 | Disposition: A | Discharge status: Home / Self Care | Payer: 59 | Source: Ambulatory Visit | Attending: General Surgery | Admitting: General Surgery

## 2015-06-13 ENCOUNTER — Encounter (HOSPITAL_COMMUNITY): Payer: Self-pay

## 2015-06-13 DIAGNOSIS — Z01818 Encounter for other preprocedural examination: Secondary | ICD-10-CM | POA: Insufficient documentation

## 2015-06-13 HISTORY — DX: Pneumonia, unspecified organism: J18.9

## 2015-06-13 LAB — COMPREHENSIVE METABOLIC PANEL
ALBUMIN: 4.1 g/dL (ref 3.5–5.0)
ALK PHOS: 85 U/L (ref 38–126)
ALT: 15 U/L (ref 14–54)
ANION GAP: 7 (ref 5–15)
AST: 20 U/L (ref 15–41)
BILIRUBIN TOTAL: 0.6 mg/dL (ref 0.3–1.2)
BUN: 9 mg/dL (ref 6–20)
CALCIUM: 9 mg/dL (ref 8.9–10.3)
CO2: 26 mmol/L (ref 22–32)
Chloride: 104 mmol/L (ref 101–111)
Creatinine, Ser: 0.81 mg/dL (ref 0.44–1.00)
GFR calc Af Amer: >60 mL/min (ref >60)
GLUCOSE: 83 mg/dL (ref 65–99)
Potassium: 3.6 mmol/L (ref 3.5–5.1)
Sodium: 137 mmol/L (ref 135–145)
TOTAL PROTEIN: 7.8 g/dL (ref 6.5–8.1)

## 2015-06-13 LAB — CBC WITH DIFFERENTIAL/PLATELET
BASOS PCT: 0 % (ref 0–1)
Basophils Absolute: 0 10*3/uL (ref 0.0–0.1)
Eosinophils Absolute: 0.3 10*3/uL (ref 0.0–0.7)
Eosinophils Relative: 3 % (ref 0–5)
HEMATOCRIT: 36.9 % (ref 36.0–46.0)
HEMOGLOBIN: 11.9 g/dL — AB (ref 12.0–15.0)
LYMPHS ABS: 4 10*3/uL (ref 0.7–4.0)
LYMPHS PCT: 38 % (ref 12–46)
MCH: 28 pg (ref 26.0–34.0)
MCHC: 32.2 g/dL (ref 30.0–36.0)
MCV: 86.8 fL (ref 78.0–100.0)
MONO ABS: 0.4 10*3/uL (ref 0.1–1.0)
MONOS PCT: 4 % (ref 3–12)
NEUTROS ABS: 5.7 10*3/uL (ref 1.7–7.7)
NEUTROS PCT: 55 % (ref 43–77)
Platelets: 430 10*3/uL — ABNORMAL HIGH (ref 150–400)
RBC: 4.25 MIL/uL (ref 3.87–5.11)
RDW: 13.3 % (ref 11.5–15.5)
WBC: 10.4 10*3/uL (ref 4.0–10.5)

## 2015-06-13 LAB — HCG, SERUM, QUALITATIVE: PREG SERUM: NEGATIVE

## 2015-06-13 NOTE — H&P (Signed)
Laura Ponce 06/13/2015 9:02 AM Location: [facility] Patient #: [phone] DOB: 09/13/1986 Single / Language: Laura Ponce / Race: Black or African American Female History of Present Illness Laura Ponce M. Laura Vitelli MD; 06/13/2015 9:27 AM) Patient words: bariatric.  The patient is a 29 year old female who presents for a preoperative evaluation. She has been approved for laparoscopic sleeve gastrectomy and upper endoscopy. Her surgery is currently scheduled for August 30. She denies any medical changes since I initially met her. She denies any trips to the emergency room or hospital. She is no longer taking any prescription medication. She did get fitted for CPAP and is currently using it. She denies any chest pain, chest pressure, shortness of breath, gastroesophageal reflux disease. Her preoperative workup revealed a normal upper GI and chest x-ray. Her initial evaluation labs were also normal. She had some mild anemia with a hemoglobin of 11.9, hematocrit 36.5. Problem List/Past Medical Laura Ponce Laura Derby, MD; 06/13/2015 9:27 AM) OBSTRUCTIVE SLEEP APNEA ON CPAP (327.23  G47.33) PAIN IN BOTH KNEES, UNSPECIFIED CHRONICITY (719.46  M25.561, M25.562) OBESITY, MORBID, BMI 50 OR HIGHER (278.01  E66.01)  Past Surgical History Laura Curry, MD; 06/13/2015 9:27 AM) No pertinent past surgical history  Diagnostic Studies History Laura Curry, MD; 06/13/2015 9:27 AM) Pap Smear 1-5 years ago Colonoscopy never Mammogram never  Allergies Laura Ponce, Laura Ponce; 06/13/2015 9:03 AM) No Known Drug Allergies 11/21/2014  Medication History Laura Curry, MD; 06/13/2015 9:27 AM) Laura Ponce FE 1.5/30 (1.5-30MG-MCG Tablet, Oral) Active. Medications Reconciled OxyCODONE HCl (5MG/5ML Solution, 5-10 Milliliter Oral every four hours, as needed, Taken starting 06/13/2015) Active. Zofran ODT (4MG Tablet Disperse, 1 (one) Tablet Disperse Oral every six hours, as needed, Taken starting 06/13/2015)  Active.  Social History Laura Curry, MD; 06/13/2015 9:27 AM) Tobacco use Never smoker. Caffeine use Carbonated beverages, Tea. No drug use Alcohol use Occasional alcohol use.  Family History Laura Curry, MD; 06/13/2015 9:27 AM) Bleeding disorder Mother. Hypertension Father.  Pregnancy / Birth History Laura Curry, MD; 06/13/2015 9:27 AM) Laura Ponce 3 Maternal Ponce 38-25 Contraceptive History Oral contraceptives. Ponce at menarche 10 years. Para 1 Regular periods     Review of Systems Laura Curry, MD; 06/13/2015 9:24 00) General Not Present- Appetite Loss, Chills, Fatigue, Fever, Night Sweats, Weight Gain and Weight Loss. Skin Not Present- Change in Wart/Mole, Dryness, Hives, Jaundice, New Lesions, Non-Healing Wounds, Rash and Ulcer. HEENT Not Present- Earache, Hearing Loss, Hoarseness, Nose Bleed, Oral Ulcers, Ringing in the Ears, Seasonal Allergies, Sinus Pain, Sore Throat, Visual Disturbances, Wears glasses/contact lenses and Yellow Eyes. Respiratory Present- Snoring. Not Present- Bloody sputum, Chronic Cough, Difficulty Breathing and Wheezing. Breast Not Present- Breast Mass, Breast Pain, Nipple Discharge and Skin Changes. Cardiovascular Not Present- Chest Pain, Difficulty Breathing Lying Down, Leg Cramps, Palpitations, Rapid Heart Rate, Shortness of Breath and Swelling of Extremities. Gastrointestinal Not Present- Abdominal Pain, Bloating, Bloody Stool, Change in Bowel Habits, Chronic diarrhea, Constipation, Difficulty Swallowing, Excessive gas, Gets full quickly at meals, Hemorrhoids, Indigestion, Nausea, Rectal Pain and Vomiting. Female Genitourinary Not Present- Frequency, Nocturia, Painful Urination, Pelvic Pain and Urgency. Musculoskeletal Not Present- Back Pain, Joint Pain, Joint Stiffness, Muscle Pain, Muscle Weakness and Swelling of Extremities. Neurological Not Present- Decreased Memory, Fainting, Headaches, Numbness, Seizures, Tingling, Tremor, Trouble  walking and Weakness. Psychiatric Not Present- Anxiety, Bipolar, Change in Sleep Pattern, Depression, Fearful and Frequent crying. Endocrine Not Present- Cold Intolerance, Excessive Hunger, Hair Changes, Heat Intolerance, Hot flashes and New Diabetes. Hematology Not Present- Easy  Bruising, Excessive bleeding, Gland problems, HIV and Persistent Infections.  Vitals ([name]; 06/13/2015 9:03 AM) 06/13/2015 9:02 AM Weight: 312 lb Height: 61in Body Surface Area: 2.47 m Body Mass Index: 58.95 kg/m Temp.: 25F(Temporal)  Pulse: 77 (Regular)  BP: 128/76 (Sitting, Left Arm, Standard)     Physical Exam Laura Ponce M. [name] MD; 06/13/2015 9:23 AM)  General Mental Status-Alert. General Appearance-Consistent with stated Ponce. Hydration-Well hydrated. Voice-Normal. Note: morbidly obese, pear   Head and Neck Head-normocephalic, atraumatic with no lesions or palpable masses. Trachea-midline. Thyroid Gland Characteristics - normal size and consistency.  Eye Eyeball - Bilateral-Extraocular movements intact. Sclera/Conjunctiva - Bilateral-No scleral icterus.  Chest and Lung Exam Chest and lung exam reveals -quiet, even and easy respiratory effort with no use of accessory muscles and on auscultation, normal breath sounds, no adventitious sounds and normal vocal resonance. Inspection Chest Wall - Normal. Back - normal.  Breast - Did not examine.  Cardiovascular Cardiovascular examination reveals -normal heart sounds, regular rate and rhythm with no murmurs and normal pedal pulses bilaterally.  Abdomen Inspection Inspection of the abdomen reveals - No Hernias. Skin - Scar - no surgical scars. Palpation/Percussion Palpation and Percussion of the abdomen reveal - Soft, Non Tender, No Rebound tenderness, No Rigidity (guarding) and No hepatosplenomegaly. Auscultation Auscultation of the abdomen reveals - Bowel sounds normal.  Peripheral Vascular Upper  Extremity Palpation - Pulses bilaterally normal.  Neurologic Neurologic evaluation reveals -alert and oriented x 3 with no impairment of recent or remote memory. Mental Status-Normal.  Neuropsychiatric The patient's mood and affect are described as -normal. Judgment and Insight-insight is appropriate concerning matters relevant to self.  Musculoskeletal Normal Exam - Left-Upper Extremity Strength Normal and Lower Extremity Strength Normal. Normal Exam - Right-Upper Extremity Strength Normal and Lower Extremity Strength Normal. Note: b/l knee crepitus   Lymphatic Head & Neck  General Head & Neck Lymphatics: Bilateral - Description - Normal. Axillary - Did not examine. Femoral & Inguinal - Did not examine.    Assessment & Plan Laura Ponce M. Rosalina Dingwall MD; 06/13/2015 9:27 AM)  OBESITY, MORBID, BMI 50 OR HIGHER (278.01  E66.01) Impression: We reviewed her preoperative workup. We discussed the typical hospitalization. We discussed the typical postoperative recovery course. We discussed the importance of the preoperative diet. I reminded her to bring her CPAP mask with her. She was given prescriptions for postoperative pain and nausea medications.  Current Plans Pt Education - EMW_preopbariatric Started OxyCODONE HCl 5MG/5ML, 5-10 Milliliter every four hours, as needed, 200 Milliliter, 06/13/2015, No Refill. Started Zofran ODT 4MG, 1 (one) Tablet Disperse every six hours, as needed, #30, 06/13/2015, No Refill. PAIN IN BOTH KNEES, UNSPECIFIED CHRONICITY (719.46  M25.561)  OBSTRUCTIVE SLEEP APNEA ON CPAP (327.23  G47.33)  Leighton Ruff. Redmond Pulling, MD, FACS General, Bariatric, & Minimally Invasive Surgery [facility], [place]

## 2015-06-17 ENCOUNTER — Encounter (HOSPITAL_COMMUNITY): Payer: Self-pay | Admitting: *Deleted

## 2015-06-17 ENCOUNTER — Inpatient Hospital Stay (HOSPITAL_COMMUNITY)
Admission: RE | Admit: 2015-06-17 | Discharge: 2015-06-19 | DRG: 621 | Disposition: A | Discharge status: Home / Self Care | Payer: 59 | Source: Ambulatory Visit | Attending: General Surgery | Admitting: General Surgery

## 2015-06-17 ENCOUNTER — Inpatient Hospital Stay (HOSPITAL_COMMUNITY): Payer: 59 | Admitting: Anesthesiology

## 2015-06-17 ENCOUNTER — Encounter (HOSPITAL_COMMUNITY)
Admission: RE | Disposition: A | Discharge status: Home / Self Care | Payer: Self-pay | Source: Ambulatory Visit | Attending: General Surgery

## 2015-06-17 DIAGNOSIS — Z9989 Dependence on other enabling machines and devices: Secondary | ICD-10-CM

## 2015-06-17 DIAGNOSIS — Z9884 Bariatric surgery status: Secondary | ICD-10-CM

## 2015-06-17 DIAGNOSIS — Z8249 Family history of ischemic heart disease and other diseases of the circulatory system: Secondary | ICD-10-CM

## 2015-06-17 DIAGNOSIS — Z01812 Encounter for preprocedural laboratory examination: Secondary | ICD-10-CM

## 2015-06-17 DIAGNOSIS — M25561 Pain in right knee: Secondary | ICD-10-CM | POA: Diagnosis present

## 2015-06-17 DIAGNOSIS — G4733 Obstructive sleep apnea (adult) (pediatric): Secondary | ICD-10-CM | POA: Diagnosis present

## 2015-06-17 DIAGNOSIS — G8929 Other chronic pain: Secondary | ICD-10-CM | POA: Diagnosis present

## 2015-06-17 DIAGNOSIS — Z6841 Body Mass Index (BMI) 40.0 and over, adult: Secondary | ICD-10-CM

## 2015-06-17 DIAGNOSIS — D649 Anemia, unspecified: Secondary | ICD-10-CM | POA: Diagnosis present

## 2015-06-17 DIAGNOSIS — M25562 Pain in left knee: Secondary | ICD-10-CM | POA: Diagnosis present

## 2015-06-17 HISTORY — PX: LAPAROSCOPIC GASTRIC SLEEVE RESECTION: SHX5895

## 2015-06-17 HISTORY — PX: UPPER GI ENDOSCOPY: SHX6162

## 2015-06-17 LAB — PREGNANCY, URINE: Preg Test, Ur: NEGATIVE

## 2015-06-17 LAB — HEMOGLOBIN AND HEMATOCRIT, BLOOD
HCT: 35.1 % — ABNORMAL LOW (ref 36.0–46.0)
HEMOGLOBIN: 11.5 g/dL — AB (ref 12.0–15.0)

## 2015-06-17 SURGERY — GASTRECTOMY, SLEEVE, LAPAROSCOPIC
Anesthesia: General

## 2015-06-17 MED ORDER — EPHEDRINE SULFATE 50 MG/ML IJ SOLN
INTRAMUSCULAR | Status: AC
  Filled 2015-06-17: qty 1

## 2015-06-17 MED ORDER — FENTANYL CITRATE (PF) 100 MCG/2ML IJ SOLN
INTRAMUSCULAR | Status: DC | PRN
  Administered 2015-06-17 (×2): 50 ug via INTRAVENOUS
  Administered 2015-06-17: 100 ug via INTRAVENOUS
  Administered 2015-06-17: 50 ug via INTRAVENOUS

## 2015-06-17 MED ORDER — SODIUM CHLORIDE 0.9 % IJ SOLN
INTRAMUSCULAR | Status: AC
  Filled 2015-06-17: qty 50

## 2015-06-17 MED ORDER — HYDROMORPHONE HCL 1 MG/ML IJ SOLN: 0.2500 mg | INTRAMUSCULAR | Status: DC | PRN

## 2015-06-17 MED ORDER — METOCLOPRAMIDE HCL 5 MG/ML IJ SOLN
10.0000 mg | Freq: Three times a day (TID) | INTRAMUSCULAR | Status: DC | PRN
  Administered 2015-06-17 – 2015-06-18 (×2): 10 mg via INTRAVENOUS
  Filled 2015-06-17 (×2): qty 2

## 2015-06-17 MED ORDER — CHLORHEXIDINE GLUCONATE 4 % EX LIQD: 60.0000 mL | Freq: Once | CUTANEOUS | Status: DC

## 2015-06-17 MED ORDER — KCL IN DEXTROSE-NACL 20-5-0.45 MEQ/L-%-% IV SOLN
INTRAVENOUS | Status: DC
  Administered 2015-06-17 – 2015-06-19 (×6): via INTRAVENOUS
  Filled 2015-06-17 (×8): qty 1000

## 2015-06-17 MED ORDER — ACETAMINOPHEN 10 MG/ML IV SOLN
1000.0000 mg | Freq: Four times a day (QID) | INTRAVENOUS | Status: AC
  Administered 2015-06-17 – 2015-06-18 (×4): 1000 mg via INTRAVENOUS
  Filled 2015-06-17 (×5): qty 100

## 2015-06-17 MED ORDER — DEXAMETHASONE SODIUM PHOSPHATE 4 MG/ML IJ SOLN
INTRAMUSCULAR | Status: DC | PRN
  Administered 2015-06-17: 10 mg via INTRAVENOUS

## 2015-06-17 MED ORDER — ACETAMINOPHEN 160 MG/5ML PO SOLN: 325.0000 mg | ORAL | Status: DC | PRN

## 2015-06-17 MED ORDER — NEOSTIGMINE METHYLSULFATE 10 MG/10ML IV SOLN
INTRAVENOUS | Status: DC | PRN
  Administered 2015-06-17: 5 mg via INTRAVENOUS

## 2015-06-17 MED ORDER — LACTATED RINGERS IR SOLN
Status: DC | PRN
  Administered 2015-06-17: 1000 mL

## 2015-06-17 MED ORDER — GLYCOPYRROLATE 0.2 MG/ML IJ SOLN
INTRAMUSCULAR | Status: DC | PRN
  Administered 2015-06-17: 1 mg via INTRAVENOUS

## 2015-06-17 MED ORDER — FENTANYL CITRATE (PF) 250 MCG/5ML IJ SOLN
INTRAMUSCULAR | Status: AC
  Filled 2015-06-17: qty 25

## 2015-06-17 MED ORDER — CEFOTETAN DISODIUM-DEXTROSE 2-2.08 GM-% IV SOLR
INTRAVENOUS | Status: AC
  Filled 2015-06-17: qty 50

## 2015-06-17 MED ORDER — CEFOTETAN DISODIUM-DEXTROSE 2-2.08 GM-% IV SOLR
2.0000 g | INTRAVENOUS | Status: AC
  Administered 2015-06-17: 2 g via INTRAVENOUS

## 2015-06-17 MED ORDER — 0.9 % SODIUM CHLORIDE (POUR BTL) OPTIME
TOPICAL | Status: DC | PRN
  Administered 2015-06-17: 1000 mL

## 2015-06-17 MED ORDER — DEXMEDETOMIDINE HCL IN NACL 400 MCG/100ML IV SOLN
INTRAVENOUS | Status: DC | PRN
  Administered 2015-06-17: 0.7 ug/kg/h via INTRAVENOUS

## 2015-06-17 MED ORDER — LIDOCAINE HCL (CARDIAC) 20 MG/ML IV SOLN
INTRAVENOUS | Status: AC
  Filled 2015-06-17: qty 5

## 2015-06-17 MED ORDER — PROPOFOL 10 MG/ML IV BOLUS
INTRAVENOUS | Status: AC
  Filled 2015-06-17: qty 20

## 2015-06-17 MED ORDER — UNJURY CHOCOLATE CLASSIC POWDER
2.0000 [oz_av] | Freq: Four times a day (QID) | ORAL | Status: DC
  Administered 2015-06-19: 2 [oz_av] via ORAL

## 2015-06-17 MED ORDER — UNJURY VANILLA POWDER: 2.0000 [oz_av] | Freq: Four times a day (QID) | ORAL | Status: DC

## 2015-06-17 MED ORDER — OXYCODONE HCL 5 MG PO TABS: 5.0000 mg | ORAL_TABLET | Freq: Once | ORAL | Status: DC | PRN

## 2015-06-17 MED ORDER — ONDANSETRON HCL 4 MG/2ML IJ SOLN
4.0000 mg | INTRAMUSCULAR | Status: DC | PRN
  Administered 2015-06-17 – 2015-06-19 (×7): 4 mg via INTRAVENOUS
  Filled 2015-06-17 (×7): qty 2

## 2015-06-17 MED ORDER — ROCURONIUM BROMIDE 100 MG/10ML IV SOLN
INTRAVENOUS | Status: DC | PRN
  Administered 2015-06-17: 30 mg via INTRAVENOUS
  Administered 2015-06-17: 40 mg via INTRAVENOUS

## 2015-06-17 MED ORDER — DEXMEDETOMIDINE HCL IN NACL 200 MCG/50ML IV SOLN
INTRAVENOUS | Status: DC | PRN
  Administered 2015-06-17: .7 ug/kg/h via INTRAVENOUS

## 2015-06-17 MED ORDER — MIDAZOLAM HCL 2 MG/2ML IJ SOLN
INTRAMUSCULAR | Status: AC
Start: 2015-06-17 — End: 2015-06-17
  Filled 2015-06-17: qty 4

## 2015-06-17 MED ORDER — ONDANSETRON HCL 4 MG/2ML IJ SOLN
INTRAMUSCULAR | Status: DC | PRN
  Administered 2015-06-17: 4 mg via INTRAVENOUS

## 2015-06-17 MED ORDER — EPHEDRINE SULFATE 50 MG/ML IJ SOLN
INTRAMUSCULAR | Status: DC | PRN
  Administered 2015-06-17: 10 mg via INTRAVENOUS

## 2015-06-17 MED ORDER — METOCLOPRAMIDE HCL 5 MG/ML IJ SOLN
INTRAMUSCULAR | Status: DC | PRN
  Administered 2015-06-17: 10 mg via INTRAVENOUS

## 2015-06-17 MED ORDER — OXYCODONE HCL 5 MG/5ML PO SOLN
5.0000 mg | Freq: Once | ORAL | Status: DC | PRN
  Filled 2015-06-17: qty 5

## 2015-06-17 MED ORDER — PROMETHAZINE HCL 25 MG/ML IJ SOLN
6.2500 mg | INTRAMUSCULAR | Status: AC | PRN
Start: 2015-06-17 — End: 2015-06-17
  Administered 2015-06-17 (×2): 6.25 mg via INTRAVENOUS

## 2015-06-17 MED ORDER — BUPIVACAINE LIPOSOME 1.3 % IJ SUSP
INTRAMUSCULAR | Status: DC | PRN
  Administered 2015-06-17: 20 mL

## 2015-06-17 MED ORDER — MIDAZOLAM HCL 5 MG/5ML IJ SOLN
INTRAMUSCULAR | Status: DC | PRN
  Administered 2015-06-17: 2 mg via INTRAVENOUS

## 2015-06-17 MED ORDER — LIDOCAINE HCL (CARDIAC) 20 MG/ML IV SOLN
INTRAVENOUS | Status: DC | PRN
  Administered 2015-06-17: 50 mg via INTRAVENOUS

## 2015-06-17 MED ORDER — PANTOPRAZOLE SODIUM 40 MG IV SOLR
40.0000 mg | Freq: Every day | INTRAVENOUS | Status: DC
  Administered 2015-06-17 – 2015-06-18 (×2): 40 mg via INTRAVENOUS
  Filled 2015-06-17 (×3): qty 40

## 2015-06-17 MED ORDER — PROMETHAZINE HCL 25 MG/ML IJ SOLN
INTRAMUSCULAR | Status: AC
  Filled 2015-06-17: qty 1

## 2015-06-17 MED ORDER — PROMETHAZINE HCL 25 MG/ML IJ SOLN
12.5000 mg | Freq: Four times a day (QID) | INTRAMUSCULAR | Status: DC | PRN
  Administered 2015-06-18: 12.5 mg via INTRAVENOUS
  Filled 2015-06-17: qty 1

## 2015-06-17 MED ORDER — PROPOFOL 10 MG/ML IV BOLUS
INTRAVENOUS | Status: DC | PRN
  Administered 2015-06-17: 200 mg via INTRAVENOUS

## 2015-06-17 MED ORDER — GLYCOPYRROLATE 0.2 MG/ML IJ SOLN
INTRAMUSCULAR | Status: AC
Start: 2015-06-17 — End: 2015-06-17
  Filled 2015-06-17: qty 4

## 2015-06-17 MED ORDER — ACETAMINOPHEN 160 MG/5ML PO SOLN: 650.0000 mg | ORAL | Status: DC | PRN

## 2015-06-17 MED ORDER — SUCCINYLCHOLINE CHLORIDE 20 MG/ML IJ SOLN
INTRAMUSCULAR | Status: DC | PRN
  Administered 2015-06-17: 180 mg via INTRAVENOUS

## 2015-06-17 MED ORDER — OXYCODONE HCL 5 MG/5ML PO SOLN
5.0000 mg | ORAL | Status: DC | PRN
  Administered 2015-06-18: 5 mg via ORAL
  Administered 2015-06-19 (×2): 10 mg via ORAL
  Filled 2015-06-17: qty 10
  Filled 2015-06-17: qty 5
  Filled 2015-06-17: qty 10

## 2015-06-17 MED ORDER — NEOSTIGMINE METHYLSULFATE 10 MG/10ML IV SOLN
INTRAVENOUS | Status: AC
  Filled 2015-06-17: qty 1

## 2015-06-17 MED ORDER — HEPARIN SODIUM (PORCINE) 5000 UNIT/ML IJ SOLN
5000.0000 [IU] | INTRAMUSCULAR | Status: AC
  Administered 2015-06-17: 5000 [IU] via SUBCUTANEOUS
  Filled 2015-06-17: qty 1

## 2015-06-17 MED ORDER — DEXAMETHASONE SODIUM PHOSPHATE 10 MG/ML IJ SOLN
INTRAMUSCULAR | Status: AC
  Filled 2015-06-17: qty 1

## 2015-06-17 MED ORDER — KETOROLAC TROMETHAMINE 30 MG/ML IJ SOLN: 30.0000 mg | Freq: Once | INTRAMUSCULAR | Status: DC

## 2015-06-17 MED ORDER — SODIUM CHLORIDE 0.9 % IJ SOLN
INTRAMUSCULAR | Status: DC | PRN
  Administered 2015-06-17: 50 mL via INTRAVENOUS

## 2015-06-17 MED ORDER — LACTATED RINGERS IV SOLN
INTRAVENOUS | Status: DC
  Administered 2015-06-17: 1000 mL via INTRAVENOUS
  Administered 2015-06-17: 15:00:00 via INTRAVENOUS

## 2015-06-17 MED ORDER — MORPHINE SULFATE (PF) 2 MG/ML IV SOLN
2.0000 mg | INTRAVENOUS | Status: DC | PRN
  Administered 2015-06-17 – 2015-06-18 (×2): 2 mg via INTRAVENOUS
  Filled 2015-06-17 (×3): qty 1

## 2015-06-17 MED ORDER — STERILE WATER FOR IRRIGATION IR SOLN
Status: DC | PRN
  Administered 2015-06-17: 2000 mL

## 2015-06-17 MED ORDER — GLYCOPYRROLATE 0.2 MG/ML IJ SOLN
INTRAMUSCULAR | Status: AC
  Filled 2015-06-17: qty 1

## 2015-06-17 MED ORDER — BUPIVACAINE LIPOSOME 1.3 % IJ SUSP
20.0000 mL | Freq: Once | INTRAMUSCULAR | Status: DC
  Filled 2015-06-17: qty 20

## 2015-06-17 MED ORDER — DEXMEDETOMIDINE HCL IN NACL 200 MCG/50ML IV SOLN
0.0000 ug/kg/h | INTRAVENOUS | Status: DC
  Filled 2015-06-17: qty 50

## 2015-06-17 MED ORDER — ENOXAPARIN SODIUM 30 MG/0.3ML ~~LOC~~ SOLN
30.0000 mg | Freq: Two times a day (BID) | SUBCUTANEOUS | Status: DC
  Administered 2015-06-18 – 2015-06-19 (×3): 30 mg via SUBCUTANEOUS
  Filled 2015-06-17 (×5): qty 0.3

## 2015-06-17 MED ORDER — UNJURY CHICKEN SOUP POWDER: 2.0000 [oz_av] | Freq: Four times a day (QID) | ORAL | Status: DC

## 2015-06-17 SURGICAL SUPPLY — 59 items
APPLICATOR COTTON TIP 6IN STRL (MISCELLANEOUS) IMPLANT
APPLIER CLIP ROT 10 11.4 M/L (STAPLE)
BLADE SURG SZ11 CARB STEEL (BLADE) ×3 IMPLANT
CABLE HIGH FREQUENCY MONO STRZ (ELECTRODE) ×3 IMPLANT
CHLORAPREP W/TINT 26ML (MISCELLANEOUS) ×6 IMPLANT
CLIP APPLIE ROT 10 11.4 M/L (STAPLE) IMPLANT
COVER SURGICAL LIGHT HANDLE (MISCELLANEOUS) ×3 IMPLANT
DERMABOND ADVANCED (GAUZE/BANDAGES/DRESSINGS) ×1
DERMABOND ADVANCED .7 DNX12 (GAUZE/BANDAGES/DRESSINGS) ×2 IMPLANT
DEVICE PMI PUNCTURE CLOSURE (MISCELLANEOUS) ×3 IMPLANT
DEVICE SUT QUICK LOAD TK 5 (STAPLE) IMPLANT
DEVICE SUT TI-KNOT TK 5X26 (MISCELLANEOUS) IMPLANT
DEVICE SUTURE ENDOST 10MM (ENDOMECHANICALS) IMPLANT
DEVICE TROCAR PUNCTURE CLOSURE (ENDOMECHANICALS) ×3 IMPLANT
DRAPE CAMERA CLOSED 9X96 (DRAPES) ×3 IMPLANT
DRAPE UTILITY XL STRL (DRAPES) ×6 IMPLANT
ELECT REM PT RETURN 9FT ADLT (ELECTROSURGICAL) ×3
ELECTRODE REM PT RTRN 9FT ADLT (ELECTROSURGICAL) ×2 IMPLANT
GAUZE SPONGE 4X4 12PLY STRL (GAUZE/BANDAGES/DRESSINGS) IMPLANT
GLOVE BIOGEL M STRL SZ7.5 (GLOVE) ×3 IMPLANT
GOWN STRL REUS W/TWL XL LVL3 (GOWN DISPOSABLE) ×15 IMPLANT
HOVERMATT SINGLE USE (MISCELLANEOUS) ×3 IMPLANT
KIT BASIN OR (CUSTOM PROCEDURE TRAY) ×3 IMPLANT
NEEDLE SPNL 22GX3.5 QUINCKE BK (NEEDLE) ×3 IMPLANT
PACK UNIVERSAL I (CUSTOM PROCEDURE TRAY) ×3 IMPLANT
PEN SKIN MARKING BROAD (MISCELLANEOUS) ×3 IMPLANT
RELOAD STAPLER BLUE 60MM (STAPLE) ×4 IMPLANT
RELOAD STAPLER GOLD 60MM (STAPLE) ×2 IMPLANT
RELOAD STAPLER GREEN 60MM (STAPLE) ×6 IMPLANT
SCISSORS LAP 5X45 EPIX DISP (ENDOMECHANICALS) ×3 IMPLANT
SEALANT SURGICAL APPL DUAL CAN (MISCELLANEOUS) IMPLANT
SET IRRIG TUBING LAPAROSCOPIC (IRRIGATION / IRRIGATOR) ×3 IMPLANT
SHEARS CURVED HARMONIC AC 45CM (MISCELLANEOUS) ×3 IMPLANT
SLEEVE ADV FIXATION 5X100MM (TROCAR) ×6 IMPLANT
SLEEVE GASTRECTOMY 36FR VISIGI (MISCELLANEOUS) ×3 IMPLANT
SLEEVE XCEL OPT CAN 5 100 (ENDOMECHANICALS) IMPLANT
SOLUTION ANTI FOG 6CC (MISCELLANEOUS) ×3 IMPLANT
SPONGE LAP 18X18 X RAY DECT (DISPOSABLE) ×3 IMPLANT
STAPLER ECHELON BIOABSB 60 FLE (MISCELLANEOUS) ×15 IMPLANT
STAPLER ECHELON LONG 60 440 (INSTRUMENTS) ×3 IMPLANT
STAPLER RELOAD BLUE 60MM (STAPLE) ×6
STAPLER RELOAD GOLD 60MM (STAPLE) ×3
STAPLER RELOAD GREEN 60MM (STAPLE) ×9
SUT MNCRL AB 4-0 PS2 18 (SUTURE) ×3 IMPLANT
SUT SURGIDAC NAB ES-9 0 48 120 (SUTURE) IMPLANT
SUT VICRYL 0 TIES 12 18 (SUTURE) ×3 IMPLANT
SYR 10ML ECCENTRIC (SYRINGE) ×3 IMPLANT
SYR 20CC LL (SYRINGE) ×3 IMPLANT
SYR 50ML LL SCALE MARK (SYRINGE) ×3 IMPLANT
TOWEL OR 17X26 10 PK STRL BLUE (TOWEL DISPOSABLE) ×3 IMPLANT
TOWEL OR NON WOVEN STRL DISP B (DISPOSABLE) ×3 IMPLANT
TRAY FOLEY W/METER SILVER 14FR (SET/KITS/TRAYS/PACK) IMPLANT
TRAY FOLEY W/METER SILVER 16FR (SET/KITS/TRAYS/PACK) IMPLANT
TROCAR ADV FIXATION 5X100MM (TROCAR) ×3 IMPLANT
TROCAR BLADELESS 15MM (ENDOMECHANICALS) ×3 IMPLANT
TROCAR BLADELESS OPT 5 100 (ENDOMECHANICALS) ×3 IMPLANT
TUBING CONNECTING 10 (TUBING) ×6 IMPLANT
TUBING ENDO SMARTCAP (MISCELLANEOUS) ×3 IMPLANT
TUBING FILTER THERMOFLATOR (ELECTROSURGICAL) ×3 IMPLANT

## 2015-06-17 NOTE — Op Note (Signed)
06/17/2015 Mosie Epstein [birth year] [id number]   PRE-OPERATIVE DIAGNOSIS:   Morbid obesity BMI 59 OSA on cpap Chronic bilateral knee pain  POST-OPERATIVE DIAGNOSIS:  same  PROCEDURE:  Procedure(s): LAPAROSCOPIC SLEEVE GASTRECTOMY  UPPER GI ENDOSCOPY  SURGEON:  Surgeon(s): Atilano Ina, MD FACS FASMBS  ASSISTANTS: Luretha Murphy MD FACS  ANESTHESIA:   general  DRAINS: none   BOUGIE: 36 fr ViSiGi  LOCAL MEDICATIONS USED:  MARCAINE + Exparel  SPECIMEN:  Source of Specimen:  Greater curvature of stomach  DISPOSITION OF SPECIMEN:  PATHOLOGY  COUNTS:  YES  INDICATION FOR PROCEDURE: This is a very pleasant 29 year old morbidly obese female who has had unsuccessful attempts for sustained weight loss. She presents today for a planned laparoscopic sleeve gastrectomy with upper endoscopy. We have discussed the risk and benefits of the procedure extensively preoperatively. Please see my separate notes.  PROCEDURE: After obtaining informed consent and receiving 5000 units of subcutaneous heparin, the patient was brought to the operating room at [facility] and placed supine on the operating room table. General endotracheal anesthesia was established. Sequential compression devices were placed. A orogastric tube was placed. The patient's abdomen was prepped and draped in the usual standard surgical fashion. She received preoperative IV antibiotics. A surgical timeout was performed. Her preop UGI showed no hiatal hernia and she had no symptoms of GERD.  Access to the abdomen was achieved using a 5 mm 0 laparoscope thru a 5 mm trocar In the left upper Quadrant 2 fingerbreadths below the left subcostal margin using the Optiview technique. Pneumoperitoneum was smoothly established up to 15 mm of mercury. The laparoscope was advanced and the abdominal cavity was surveilled. The patient was then placed in reverse Trendelenburg. There was no evidence of a hiatal hernia on laparoscopy -  gap in the left and right crus anteriorly.  A 5 mm trocar was placed slightly above and to the left of the umbilicus under direct visualization.  The [facility] liver retractor was placed under the left lobe of the liver through a 5 mm trocar incision site in the subxiphoid position. She had a thin filmy adhesion between surface of left hepatic lobe and abdominal wall which was left alone.  A 5 mm trocar was placed in the lateral right upper quadrant along with a 15 mm trocar in the mid right abdomen. A final 5 mm trocar was placed in the lateral LUQ.  All under direct visualization after local had been infiltrated.  The stomach was inspected. It was completely decompressed and the orogastric tube was removed.  We identified the pylorus and measured 6 cm proximal to the pylorus and identified an area of where we would start taking down the short gastric vessels. Harmonic scalpel was used to take down the short gastric vessels along the greater curvature of the stomach. We were able to enter the lesser sac. We continued to march along the greater curvature of the stomach taking down the short gastrics. As we approached the gastrosplenic ligament we took care in this area not to injure the spleen. We were able to take down the entire gastrosplenic ligament. We then mobilized the fundus away from the left crus of diaphragm. There were not any significant posterior gastric avascular attachments. This left the stomach completely mobilized. No vessels had been taken down along the lesser curvature of the stomach.  We then reidentified the pylorus. A 36Fr ViSiGi was then placed in the oropharynx and advanced down into the stomach and placed in the  distal antrum and positioned along the lesser curvature. It was placed under suction which secured the 36Fr ViSiGi in place along the lesser curve. Then using the Ethicon echelon 60 mm stapler with a green load with Seamguard, I placed a stapler along the antrum  approximately 5 cm from the pylorus. The stapler was angled so that there is ample room at the angularis incisura. I then fired the first staple load after inspecting it posteriorly to ensure adequate space both anteriorly and posteriorly. At this point I still was not completely past the angularis so with another green load with Seamguard, I placed the stapler in position just inside the prior stapleline. We then rotated the stomach to insure that there was adequate anteriorly as well as posteriorly. The stapler was then fired. I used another 60mm green cartridge with seamguard. At this point I started using 60 mm gold load staple cartridges with Seamguard. The echelon stapler was then repositioned with a 60 mm gold load with Seamguard and we continued to march up along the ViSiGi. My assistant was holding traction along the greater curvature stomach along the cauterized short gastric vessels ensuring that the stomach was symmetrically retracted. Prior to each firing of the staple, we rotated the stomach to ensure that there is adequate stomach left.  As we approached the fundus, I used 60 mm blue cartridge with Seamguard aiming slightly lateral to the esophageal fat pad.Although the staples on this fire had completely gone thru the last part of the stomach it had not completely cut it. Therefore 1 additional 60 blue load was used to free the remaining stomach. The sleeve was inspected. There is no evidence of cork screw. The staple line appeared hemostatic. The CRNA inflated the ViSiGi to the green zone and the upper abdomen was flooded with saline. There were no bubbles. The sleeve was decompressed and the ViSiGi removed. My assistant scrubbed out and performed an upper endoscopy. The sleeve easily distended with air and the scope was easily advanced to the pylorus. There is no evidence of internal bleeding or cork screwing. There was no narrowing at the angularis. There is no evidence of bubbles. Please see his  operative note for further details. The gastric sleeve was decompressed and the endoscope was removed.  The greater curvature the stomach was grasped with a laparoscopic grasper and removed from the 15 mm trocar site.  The liver retractor was removed. I then closed the 15 mm trocar site with 1 interrupted 0 Vicryl sutures through the fascia using the endoclose. The closure was viewed laparoscopically and it was airtight. 70 cc of Exparel was then infiltrated in the preperitoneal spaces around the trocar sites. Pneumoperitoneum was released. All trocar sites were closed with a 4-0 Monocryl in a subcuticular fashion followed by the application of Dermabond. The patient was extubated and taken to the recovery room in stable condition. All needle, instrument, and sponge counts were correct x2. There are no immediate complications  (3) 60 mm green with Seamguard (1) 60 mm gold with seamguard (2) 60 mm blue with seamguard  PLAN OF CARE: Admit to inpatient   PATIENT DISPOSITION:  PACU - hemodynamically stable.   Delay start of Pharmacological VTE agent (>24hrs) due to surgical blood loss or risk of bleeding:  no  Mary Sella. Andrey Campanile, MD, FACS FASMBS General, Bariatric, & Minimally Invasive Surgery [facility], [place]

## 2015-06-17 NOTE — Anesthesia Postprocedure Evaluation (Signed)
  Anesthesia Post-op Note  Patient: Laura Ponce  Procedure(s) Performed: Procedure(s): LAPAROSCOPIC GASTRIC SLEEVE RESECTION UPPER ENDOSCOPY (N/A) UPPER GI ENDOSCOPY  Patient Location: PACU  Anesthesia Type:General  Level of Consciousness: awake and alert   Airway and Oxygen Therapy: Patient Spontanous Breathing  Post-op Pain: mild  Post-op Assessment: Post-op Vital signs reviewed              Post-op Vital Signs: Reviewed  Last Vitals:  Filed Vitals:   06/17/15 1715  BP: 112/71  Pulse: 84  Temp: 37 C  Resp: 24    Complications: No apparent anesthesia complications

## 2015-06-17 NOTE — Op Note (Signed)
Laura Ponce [id number] [birth year] 06/17/2015  Preoperative diagnosis: morbid obesity for sleeve gastrectomy  Postoperative diagnosis: Same   Procedure: Upper endoscopy   Surgeon: Susy Frizzle B. Daphine Deutscher  M.D., FACS   Anesthesia: Gen.   Indications for procedure: This patient was undergoing a sleeve gastrectomy.    Description of procedure: The endoscopy was placed in the mouth and into the oropharynx and under endoscopic vision it was advanced to the esophagogastric junction.  The pouch was insufflated and the sleeve was found to be nicely tubular.  The pylorus was identified and was appropriately small.   No bleeding or leaks were detected.  The scope was withdrawn without difficulty.     Matt B. Daphine Deutscher, MD, FACS General, Bariatric, & Minimally Invasive Surgery [facility], [place]

## 2015-06-17 NOTE — Interval H&P Note (Signed)
History and Physical Interval Note:  06/17/2015 1:00 PM  Laura Ponce  has presented today for surgery, with the diagnosis of Morbid Obesity  The various methods of treatment have been discussed with the patient and family. After consideration of risks, benefits and other options for treatment, the patient has consented to  Procedure(s): LAPAROSCOPIC GASTRIC SLEEVE RESECTION (N/A) as a surgical intervention .  The patient's history has been reviewed, patient examined, no change in status, stable for surgery.  I have reviewed the patient's chart and labs.  Questions were answered to the patient's satisfaction.    Mary Sella. Andrey Campanile, MD, FACS General, Bariatric, & Minimally Invasive Surgery [facility], [place]   [facility]

## 2015-06-17 NOTE — Transfer of Care (Signed)
Immediate Anesthesia Transfer of Care Note  Patient: Laura Ponce  Procedure(s) Performed: Procedure(s): LAPAROSCOPIC GASTRIC SLEEVE RESECTION UPPER ENDOSCOPY (N/A) UPPER GI ENDOSCOPY  Patient Location: PACU  Anesthesia Type:General  Level of Consciousness:  sedated, patient cooperative and responds to stimulation  Airway & Oxygen Therapy:Patient Spontanous Breathing and Patient connected to face mask oxgen  Post-op Assessment:  Report given to PACU RN and Post -op Vital signs reviewed and stable  Post vital signs:  Reviewed and stable  Last Vitals:  Filed Vitals:   06/17/15 1026  BP: 140/98  Pulse: 91  Temp: 36.8 C    Complications: No apparent anesthesia complications

## 2015-06-17 NOTE — Anesthesia Preprocedure Evaluation (Addendum)
Anesthesia Evaluation  Patient identified by MRN, date of birth, ID band Patient awake    Reviewed: Allergy & Precautions, NPO status , Patient's Chart, lab work & pertinent test results  Airway Mallampati: II  TM Distance: >3 FB Neck ROM: Full    Dental  (+) Teeth Intact, Dental Advisory Given   Pulmonary sleep apnea ,  breath sounds clear to auscultation        Cardiovascular hypertension, Rhythm:Regular Rate:Normal     Neuro/Psych negative neurological ROS     GI/Hepatic negative GI ROS, Neg liver ROS,   Endo/Other  Morbid obesity  Renal/GU negative Renal ROS     Musculoskeletal negative musculoskeletal ROS (+)   Abdominal   Peds  Hematology  (+) anemia ,   Anesthesia Other Findings   Reproductive/Obstetrics                            Anesthesia Physical Anesthesia Plan  ASA: III  Anesthesia Plan: General   Post-op Pain Management:    Induction: Intravenous  Airway Management Planned: Oral ETT  Additional Equipment:   Intra-op Plan:   Post-operative Plan: Extubation in OR  Informed Consent: I have reviewed the patients History and Physical, chart, labs and discussed the procedure including the risks, benefits and alternatives for the proposed anesthesia with the patient or authorized representative who has indicated his/her understanding and acceptance.   Dental advisory given  Plan Discussed with: CRNA  Anesthesia Plan Comments:         Anesthesia Quick Evaluation

## 2015-06-17 NOTE — Anesthesia Procedure Notes (Signed)
Procedure Name: Intubation Date/Time: 06/17/2015 1:32 PM Performed by: Enriqueta Shutter D Pre-anesthesia Checklist: Patient identified, Emergency Drugs available, Suction available and Patient being monitored Patient Re-evaluated:Patient Re-evaluated prior to inductionOxygen Delivery Method: Circle System Utilized Preoxygenation: Pre-oxygenation with 100% oxygen Intubation Type: IV induction Ventilation: Mask ventilation without difficulty Laryngoscope Size: Mac and 4 Grade View: Grade I Tube type: Oral Tube size: 7.5 mm Number of attempts: 1 Airway Equipment and Method: Stylet and Oral airway Placement Confirmation: ETT inserted through vocal cords under direct vision,  positive ETCO2 and breath sounds checked- equal and bilateral Secured at: 21 cm Tube secured with: Tape Dental Injury: Teeth and Oropharynx as per pre-operative assessment

## 2015-06-17 NOTE — H&P (View-Only) (Signed)
Laura Ponce. Waddell 06/13/2015 9:02 AM Location: [facility] Patient #: [id number] DOB: 09/13/1986 Single / Language: Laura Ponce / Race: Black or African American Female History of Present Illness Laura Ponce M. Philip Kotlyar MD; 06/13/2015 9:27 AM) Patient words: bariatric.  The patient is a 29 year old female who presents for a preoperative evaluation. She has been approved for laparoscopic sleeve gastrectomy and upper endoscopy. Her surgery is currently scheduled for August 30. She denies any medical changes since I initially met her. She denies any trips to the emergency room or hospital. She is no longer taking any prescription medication. She did get fitted for CPAP and is currently using it. She denies any chest pain, chest pressure, shortness of breath, gastroesophageal reflux disease. Her preoperative workup revealed a normal upper GI and chest x-ray. Her initial evaluation labs were also normal. She had some mild anemia with a hemoglobin of 11.9, hematocrit 36.5. Problem List/Past Medical Laura Ponce [name], MD; 06/13/2015 9:27 AM) OBSTRUCTIVE SLEEP APNEA ON CPAP (327.23  G47.33) PAIN IN BOTH KNEES, UNSPECIFIED CHRONICITY (719.46  M25.561, M25.562) OBESITY, MORBID, BMI 50 OR HIGHER (278.01  E66.01)  Past Surgical History [name], MD; 06/13/2015 9:27 AM) No pertinent past surgical history  Diagnostic Studies History [name], MD; 06/13/2015 9:27 AM) Pap Smear 1-5 years ago Colonoscopy never Mammogram never  Allergies [name], [name]; 06/13/2015 9:03 AM) No Known Drug Allergies 11/21/2014  Medication History [name], MD; 06/13/2015 9:27 AM) [name] 1.5/30 (1.5-30MG-MCG Tablet, Oral) Active. Medications Reconciled OxyCODONE HCl (5MG/5ML Solution, 5-10 Milliliter Oral every four hours, as needed, Taken starting 06/13/2015) Active. Zofran ODT (4MG Tablet Disperse, 1 (one) Tablet Disperse Oral every six hours, as needed, Taken starting 06/13/2015)  Active.  Social History [name], MD; 06/13/2015 9:27 AM) Tobacco use Never smoker. Caffeine use Carbonated beverages, Tea. No drug use Alcohol use Occasional alcohol use.  Family History [name], MD; 06/13/2015 9:27 AM) Bleeding disorder Mother. Hypertension Father.  Pregnancy / Birth History [name], MD; 06/13/2015 9:27 AM) Durenda Age 2 Maternal age 38-25 Contraceptive History Oral contraceptives. Age at menarche 10 years. Para 1 Regular periods     Review of Systems [name], MD; 06/13/2015 9:24 00) General Not Present- Appetite Loss, Chills, Fatigue, Fever, Night Sweats, Weight Gain and Weight Loss. Skin Not Present- Change in Wart/Mole, Dryness, Hives, Jaundice, New Lesions, Non-Healing Wounds, Rash and Ulcer. HEENT Not Present- Earache, Hearing Loss, Hoarseness, Nose Bleed, Oral Ulcers, Ringing in the Ears, Seasonal Allergies, Sinus Pain, Sore Throat, Visual Disturbances, Wears glasses/contact lenses and Yellow Eyes. Respiratory Present- Snoring. Not Present- Bloody sputum, Chronic Cough, Difficulty Breathing and Wheezing. Breast Not Present- Breast Mass, Breast Pain, Nipple Discharge and Skin Changes. Cardiovascular Not Present- Chest Pain, Difficulty Breathing Lying Down, Leg Cramps, Palpitations, Rapid Heart Rate, Shortness of Breath and Swelling of Extremities. Gastrointestinal Not Present- Abdominal Pain, Bloating, Bloody Stool, Change in Bowel Habits, Chronic diarrhea, Constipation, Difficulty Swallowing, Excessive gas, Gets full quickly at meals, Hemorrhoids, Indigestion, Nausea, Rectal Pain and Vomiting. Female Genitourinary Not Present- Frequency, Nocturia, Painful Urination, Pelvic Pain and Urgency. Musculoskeletal Not Present- Back Pain, Joint Pain, Joint Stiffness, Muscle Pain, Muscle Weakness and Swelling of Extremities. Neurological Not Present- Decreased Memory, Fainting, Headaches, Numbness, Seizures, Tingling, Tremor, Trouble  walking and Weakness. Psychiatric Not Present- Anxiety, Bipolar, Change in Sleep Pattern, Depression, Fearful and Frequent crying. Endocrine Not Present- Cold Intolerance, Excessive Hunger, Hair Changes, Heat Intolerance, Hot flashes and New Diabetes. Hematology Not Present- Easy  Bruising, Excessive bleeding, Gland problems, HIV and Persistent Infections.  Vitals ([name]; 06/13/2015 9:03 AM) 06/13/2015 9:02 AM Weight: 312 lb Height: 61in Body Surface Area: 2.47 m Body Mass Index: 58.95 kg/m Temp.: 25F(Temporal)  Pulse: 77 (Regular)  BP: 128/76 (Sitting, Left Arm, Standard)     Physical Exam Laura Ponce M. Shady Bradish MD; 06/13/2015 9:23 AM)  General Mental Status-Alert. General Appearance-Consistent with stated age. Hydration-Well hydrated. Voice-Normal. Note: morbidly obese, pear   Head and Neck Head-normocephalic, atraumatic with no lesions or palpable masses. Trachea-midline. Thyroid Gland Characteristics - normal size and consistency.  Eye Eyeball - Bilateral-Extraocular movements intact. Sclera/Conjunctiva - Bilateral-No scleral icterus.  Chest and Lung Exam Chest and lung exam reveals -quiet, even and easy respiratory effort with no use of accessory muscles and on auscultation, normal breath sounds, no adventitious sounds and normal vocal resonance. Inspection Chest Wall - Normal. Back - normal.  Breast - Did not examine.  Cardiovascular Cardiovascular examination reveals -normal heart sounds, regular rate and rhythm with no murmurs and normal pedal pulses bilaterally.  Abdomen Inspection Inspection of the abdomen reveals - No Hernias. Skin - Scar - no surgical scars. Palpation/Percussion Palpation and Percussion of the abdomen reveal - Soft, Non Tender, No Rebound tenderness, No Rigidity (guarding) and No hepatosplenomegaly. Auscultation Auscultation of the abdomen reveals - Bowel sounds normal.  Peripheral Vascular Upper  Extremity Palpation - Pulses bilaterally normal.  Neurologic Neurologic evaluation reveals -alert and oriented x 3 with no impairment of recent or remote memory. Mental Status-Normal.  Neuropsychiatric The patient's mood and affect are described as -normal. Judgment and Insight-insight is appropriate concerning matters relevant to self.  Musculoskeletal Normal Exam - Left-Upper Extremity Strength Normal and Lower Extremity Strength Normal. Normal Exam - Right-Upper Extremity Strength Normal and Lower Extremity Strength Normal. Note: b/l knee crepitus   Lymphatic Head & Neck  General Head & Neck Lymphatics: Bilateral - Description - Normal. Axillary - Did not examine. Femoral & Inguinal - Did not examine.    Assessment & Plan Laura Ponce M. Garl Speigner MD; 06/13/2015 9:27 AM)  OBESITY, MORBID, BMI 50 OR HIGHER (278.01  E66.01) Impression: We reviewed her preoperative workup. We discussed the typical hospitalization. We discussed the typical postoperative recovery course. We discussed the importance of the preoperative diet. I reminded her to bring her CPAP mask with her. She was given prescriptions for postoperative pain and nausea medications.  Current Plans Pt Education - EMW_preopbariatric Started OxyCODONE HCl 5MG/5ML, 5-10 Milliliter every four hours, as needed, 200 Milliliter, 06/13/2015, No Refill. Started Zofran ODT 4MG, 1 (one) Tablet Disperse every six hours, as needed, #30, 06/13/2015, No Refill. PAIN IN BOTH KNEES, UNSPECIFIED CHRONICITY (719.46  M25.561)  OBSTRUCTIVE SLEEP APNEA ON CPAP (327.23  G47.33)  Leighton Ruff. Redmond Pulling, MD, FACS General, Bariatric, & Minimally Invasive Surgery [facility], [place]

## 2015-06-18 ENCOUNTER — Encounter (HOSPITAL_COMMUNITY): Payer: Self-pay | Admitting: General Surgery

## 2015-06-18 LAB — CBC WITH DIFFERENTIAL/PLATELET
BASOS PCT: 0 % (ref 0–1)
Basophils Absolute: 0 10*3/uL (ref 0.0–0.1)
EOS ABS: 0 10*3/uL (ref 0.0–0.7)
EOS PCT: 0 % (ref 0–5)
HCT: 34.3 % — ABNORMAL LOW (ref 36.0–46.0)
Hemoglobin: 11.3 g/dL — ABNORMAL LOW (ref 12.0–15.0)
Lymphocytes Relative: 14 % (ref 12–46)
Lymphs Abs: 1.9 10*3/uL (ref 0.7–4.0)
MCH: 28.5 pg (ref 26.0–34.0)
MCHC: 32.9 g/dL (ref 30.0–36.0)
MCV: 86.6 fL (ref 78.0–100.0)
MONO ABS: 0.9 10*3/uL (ref 0.1–1.0)
MONOS PCT: 6 % (ref 3–12)
NEUTROS PCT: 80 % — AB (ref 43–77)
Neutro Abs: 11.4 10*3/uL — ABNORMAL HIGH (ref 1.7–7.7)
PLATELETS: 419 10*3/uL — AB (ref 150–400)
RBC: 3.96 MIL/uL (ref 3.87–5.11)
RDW: 13.5 % (ref 11.5–15.5)
WBC: 14.2 10*3/uL — ABNORMAL HIGH (ref 4.0–10.5)

## 2015-06-18 LAB — HEMOGLOBIN AND HEMATOCRIT, BLOOD
HEMATOCRIT: 35.5 % — AB (ref 36.0–46.0)
Hemoglobin: 11.6 g/dL — ABNORMAL LOW (ref 12.0–15.0)

## 2015-06-18 LAB — COMPREHENSIVE METABOLIC PANEL
ALBUMIN: 3.6 g/dL (ref 3.5–5.0)
ALT: 59 U/L — ABNORMAL HIGH (ref 14–54)
ANION GAP: 8 (ref 5–15)
AST: 65 U/L — ABNORMAL HIGH (ref 15–41)
Alkaline Phosphatase: 78 U/L (ref 38–126)
BUN: 7 mg/dL (ref 6–20)
CO2: 25 mmol/L (ref 22–32)
Calcium: 9 mg/dL (ref 8.9–10.3)
Chloride: 104 mmol/L (ref 101–111)
Creatinine, Ser: 0.65 mg/dL (ref 0.44–1.00)
GFR calc non Af Amer: >60 mL/min (ref >60)
GLUCOSE: 132 mg/dL — AB (ref 65–99)
POTASSIUM: 4.4 mmol/L (ref 3.5–5.1)
SODIUM: 137 mmol/L (ref 135–145)
Total Bilirubin: 0.1 mg/dL — ABNORMAL LOW (ref 0.3–1.2)
Total Protein: 7.2 g/dL (ref 6.5–8.1)

## 2015-06-18 MED ORDER — HYDROMORPHONE HCL 1 MG/ML IJ SOLN
0.5000 mg | INTRAMUSCULAR | Status: DC | PRN
  Administered 2015-06-18 (×3): 1 mg via INTRAVENOUS
  Filled 2015-06-18 (×3): qty 1

## 2015-06-18 NOTE — Clinical Documentation Improvement (Signed)
Surgery  Can the diagnosis of anemia be further specified?   Iron deficiency Anemia  Nutritional anemia, including the nutrition or mineral deficits  Chronic Blood Loss Anemia, including the suspected or known cause  Other  Clinically Undetermined  Document any associated diagnoses/conditions.  Supporting Information:  "She had some mild anemia with a hemoglobin of 11.9, hematocrit 36.5" documented in H&P.   H&H 34.3 / 11.3   Please exercise your independent, professional judgment when responding. A specific answer is not anticipated or expected.  Thank You,  Shellee Milo Health Information Management [place] [phone]

## 2015-06-18 NOTE — Plan of Care (Signed)
Problem: Food- and Nutrition-Related Knowledge Deficit (NB-1.1) Goal: Nutrition education Formal process to instruct or train a patient/client in a skill or to impart knowledge to help patients/clients voluntarily manage or modify food choices and eating behavior to maintain or improve health. Outcome: Completed/Met Date Met:  06/18/15 Nutrition Education Note  Received consult for diet education per DROP protocol.   Discussed 2 week post op diet with pt. Emphasized that liquids must be non carbonated, non caffeinated, and sugar free. Fluid goals discussed. Pt to follow up with outpatient bariatric RD for further diet progression after 2 weeks. Multivitamins and minerals also reviewed. Teach back method used, pt expressed understanding, expect good compliance.   Diet: First 2 Weeks  You will see the nutritionist about two (2) weeks after your surgery. The nutritionist will increase the types of foods you can eat if you are handling liquids well:  If you have severe vomiting or nausea and cannot handle clear liquids lasting longer than 1 day, call your surgeon  Protein Shake  Drink at least 2 ounces of shake 5-6 times per day  Each serving of protein shakes (usually 8 - 12 ounces) should have a minimum of:  15 grams of protein  And no more than 5 grams of carbohydrate  Goal for protein each day:  Men = 80 grams per day  Women = 60 grams per day  Protein powder may be added to fluids such as non-fat milk or Lactaid milk or Soy milk (limit to 35 grams added protein powder per serving)   Hydration  Slowly increase the amount of water and other clear liquids as tolerated (See Acceptable Fluids)  Slowly increase the amount of protein shake as tolerated  Sip fluids slowly and throughout the day  May use sugar substitutes in small amounts (no more than 6 - 8 packets per day; i.e. Splenda)   Fluid Goal  The first goal is to drink at least 8 ounces of protein shake/drink per day (or as directed  by the nutritionist); some examples of protein shakes are Syntrax Nectar, Adkins Advantage, EAS Edge HP, and Unjury. See handout from pre-op Bariatric Education Class:  Slowly increase the amount of protein shake you drink as tolerated  You may find it easier to slowly sip shakes throughout the day  It is important to get your proteins in first  Your fluid goal is to drink 64 - 100 ounces of fluid daily  It may take a few weeks to build up to this  32 oz (or more) should be clear liquids  And  32 oz (or more) should be full liquids (see below for examples)  Liquids should not contain sugar, caffeine, or carbonation   Clear Liquids:  Water or Sugar-free flavored water (i.e. Fruit H2O, Propel)  Decaffeinated coffee or tea (sugar-free)  Crystal Lite, Wyler's Lite, Minute Maid Lite  Sugar-free Jell-O  Bouillon or broth  Sugar-free Popsicle: *Less than 20 calories each; Limit 1 per day   Full Liquids:  Protein Shakes/Drinks + 2 choices per day of other full liquids  Full liquids must be:  No More Than 12 grams of Carbs per serving  No More Than 3 grams of Fat per serving  Strained low-fat cream soup  Non-Fat milk  Fat-free Lactaid Milk  Sugar-free yogurt (Dannon Lite & Fit, Greek yogurt)     Ash Mcelwain, MS, RD, LDN Pager: [phone] After Hours Pager: [phone]        

## 2015-06-18 NOTE — Progress Notes (Signed)
Patient alert and oriented, Post op day 1.  Provided support and encouragement.  Encouraged pulmonary toilet, ambulation and small sips of liquids.  All questions answered.  Will continue to monitor. 

## 2015-06-18 NOTE — Care Management Note (Signed)
Case Management Note  Patient Details  Name: Laura Ponce MRN: [id number] Date of Birth: 03/23/1986  Subjective/Objective:              Admitted s/p sleeve gastrectomy      Action/Plan: Discharge planning  Expected Discharge Date:                  Expected Discharge Plan:  Home/Self Care  In-House Referral:  NA  Discharge planning Services  CM Consult  Post Acute Care Choice:  NA Choice offered to:  NA  DME Arranged:  N/A DME Agency:  NA  HH Arranged:  NA HH Agency:  NA  Status of Service:  Completed, signed off  Medicare Important Message Given:    Date Medicare IM Given:    Medicare IM give by:    Date Additional Medicare IM Given:    Additional Medicare Important Message give by:     If discussed at Long Length of Stay Meetings, dates discussed:    Additional Comments:  Alexis Goodell, RN 06/18/2015, 11:43 AM

## 2015-06-18 NOTE — Progress Notes (Signed)
1 Day Post-Op  Subjective: Some nausea. Thinks morphine is making her more nauseous. Ambulated. Pain ok. Not much gas pain. Used cpap last night  Objective: Vital signs in last 24 hours: Temp:  [97.7 F (36.5 C)-99.5 F (37.5 C)] 97.7 F (36.5 C) (08/31 0625) Pulse Rate:  [61-98] 61 (08/31 0625) Resp:  [16-24] 18 (08/31 0625) BP: (112-153)/(62-98) 143/76 mmHg (08/31 0625) SpO2:  [95 %-98 %] 97 % (08/31 0625) Weight:  [140.161 kg (309 lb)] 140.161 kg (309 lb) (08/30 1026) Last BM Date: 06/17/15  Intake/Output from previous day: 08/30 0701 - 08/31 0700 In: 2993.8 [I.V.:2993.8] Out: 2450 [Urine:2400; Emesis/NG output:50] Intake/Output this shift: Total I/O In: -  Out: 400 [Urine:400]  Alert, nad, nontoxic, resting comfortably. Not ill appearing cta b/l Reg Obese, soft, min TTP, incisions c/d/i +SCDs  Lab Results:   Recent Labs  06/17/15 1625 06/18/15 0548  WBC  --  14.2*  HGB 11.5* 11.3*  HCT 35.1* 34.3*  PLT  --  419*   BMET  Recent Labs  06/18/15 0548  NA 137  K 4.4  CL 104  CO2 25  GLUCOSE 132*  BUN 7  CREATININE 0.65  CALCIUM 9.0   Hepatic Function Latest Ref Rng 06/18/2015 06/13/2015 04/29/2008  Total Protein 6.5 - 8.1 g/dL 7.2 7.8 5.5(L)  Albumin 3.5 - 5.0 g/dL 3.6 4.1 2.8(L)  AST 15 - 41 U/L 65(H) 20 15  ALT 14 - 54 U/L 59(H) 15 17  Alk Phosphatase 38 - 126 U/L 78 85 40  Total Bilirubin 0.3 - 1.2 mg/dL 0.1(L) 0.6 0.7  Bilirubin, Direct - - - -     PT/INR No results for input(s): LABPROT, INR in the last 72 hours. ABG No results for input(s): PHART, HCO3 in the last 72 hours.  Invalid input(s): PCO2, PO2  Studies/Results: No results found.  Anti-infectives: Anti-infectives    Start     Dose/Rate Route Frequency Ordered Stop   06/17/15 1027  cefoTEtan in Dextrose 5% (CEFOTAN) IVPB 2 g     2 g Intravenous On call to O.R. 06/17/15 1027 06/17/15 1334      Assessment/Plan: s/p Procedure(s): LAPAROSCOPIC GASTRIC SLEEVE RESECTION UPPER  ENDOSCOPY (N/A) UPPER GI ENDOSCOPY  No fever, no tachycardia. Small amount of emesis overnight. Think she is ok to start water/pod 1 diet Will switch pain medication and see if makes difference.  Cont chemical vte prophylaxis, ambulate pulm toilet, IS; CPAP at night  Leighton Ruff. Redmond Pulling, MD, FACS General, Bariatric, & Minimally Invasive Surgery [facility], [place]   LOS: 1 day    [name] 06/18/2015

## 2015-06-19 ENCOUNTER — Ambulatory Visit: Payer: 59 | Admitting: Dietician

## 2015-06-19 LAB — CBC WITH DIFFERENTIAL/PLATELET
BASOS ABS: 0.1 10*3/uL (ref 0.0–0.1)
BASOS PCT: 0 % (ref 0–1)
EOS ABS: 0.1 10*3/uL (ref 0.0–0.7)
EOS PCT: 1 % (ref 0–5)
HEMATOCRIT: 33.9 % — AB (ref 36.0–46.0)
Hemoglobin: 11 g/dL — ABNORMAL LOW (ref 12.0–15.0)
Lymphocytes Relative: 26 % (ref 12–46)
Lymphs Abs: 3.3 10*3/uL (ref 0.7–4.0)
MCH: 28.2 pg (ref 26.0–34.0)
MCHC: 32.4 g/dL (ref 30.0–36.0)
MCV: 86.9 fL (ref 78.0–100.0)
MONO ABS: 0.6 10*3/uL (ref 0.1–1.0)
MONOS PCT: 5 % (ref 3–12)
Neutro Abs: 8.6 10*3/uL — ABNORMAL HIGH (ref 1.7–7.7)
Neutrophils Relative %: 68 % (ref 43–77)
PLATELETS: 358 10*3/uL (ref 150–400)
RBC: 3.9 MIL/uL (ref 3.87–5.11)
RDW: 13.5 % (ref 11.5–15.5)
WBC: 12.7 10*3/uL — ABNORMAL HIGH (ref 4.0–10.5)

## 2015-06-19 MED ORDER — OXYCODONE HCL 5 MG/5ML PO SOLN: 5.0000 mg | ORAL | Status: DC | PRN

## 2015-06-19 MED ORDER — ONDANSETRON 4 MG PO TBDP: 4.0000 mg | ORAL_TABLET | Freq: Three times a day (TID) | ORAL | Status: DC | PRN

## 2015-06-19 MED ORDER — PANTOPRAZOLE SODIUM 40 MG PO TBEC
40.0000 mg | DELAYED_RELEASE_TABLET | Freq: Every day | ORAL | Status: DC
Start: 2015-06-19 — End: 2019-05-22

## 2015-06-19 NOTE — Discharge Instructions (Signed)

## 2015-06-19 NOTE — Progress Notes (Signed)
2 Days Post-Op  Subjective: Some nausea. Not much pain. Ambulated. Water didn't make nausea worse.   Objective: Vital signs in last 24 hours: Temp:  [98 F (36.7 C)-99.6 F (37.6 C)] 98.1 F (36.7 C) (09/01 0520) Pulse Rate:  [62-103] 72 (09/01 0520) Resp:  [16-18] 16 (09/01 0520) BP: (108-146)/(53-98) 146/98 mmHg (09/01 0520) SpO2:  [92 %-99 %] 93 % (09/01 0520) Last BM Date: 06/17/15  Intake/Output from previous day: 08/31 0701 - 09/01 0700 In: 3000 [I.V.:3000] Out: 3370 [Urine:3370] Intake/Output this shift:    Looks comfortable. Not ill appearing.  cta b/l Reg Obese, soft, nt, incisions c/d/i  Lab Results:   Recent Labs  06/18/15 0548 06/18/15 1625 06/19/15 0600  WBC 14.2*  --  12.7*  HGB 11.3* 11.6* 11.0*  HCT 34.3* 35.5* 33.9*  PLT 419*  --  358   BMET  Recent Labs  06/18/15 0548  NA 137  K 4.4  CL 104  CO2 25  GLUCOSE 132*  BUN 7  CREATININE 0.65  CALCIUM 9.0   PT/INR No results for input(s): LABPROT, INR in the last 72 hours. ABG No results for input(s): PHART, HCO3 in the last 72 hours.  Invalid input(s): PCO2, PO2  Studies/Results: No results found.  Anti-infectives: Anti-infectives    Start     Dose/Rate Route Frequency Ordered Stop   06/17/15 1027  cefoTEtan in Dextrose 5% (CEFOTAN) IVPB 2 g     2 g Intravenous On call to O.R. 06/17/15 1027 06/17/15 1334      Assessment/Plan: s/p Procedure(s): LAPAROSCOPIC GASTRIC SLEEVE RESECTION UPPER ENDOSCOPY (N/A) UPPER GI ENDOSCOPY  Looks ok. No fever. No tachy. Adv to POD 2 diet Will check on volume status later today and make call about dc Discussed dc instructions  Laura Ponce. Andrey Campanile, MD, FACS General, Bariatric, & Minimally Invasive Surgery [facility], [place]   LOS: 2 days    Laura Ponce 06/19/2015

## 2015-06-19 NOTE — Discharge Summary (Signed)
Physician Discharge Summary  Laura Ponce WUJ:[id number] DOB: 03-05-1986 DOA: 06/17/2015  PCP: Boneta Lucks, NP  Admit date: 06/17/2015 Discharge date: 06/19/2015  Recommendations for Outpatient Follow-up:   Follow-up Information    Follow up with Atilano Ina, MD. Go on 06/27/2015.   Specialty:  General Surgery   Why:  For Post-Op Check at 1:45pm   Contact information:   [address] [place] [address] [phone]      Discharge Diagnoses:  Principal Problem:   Morbid obesity with BMI of 50.0-59.9, adult Active Problems:   S/P laparoscopic sleeve gastrectomy   OSA on CPAP   Bilateral chronic knee pain   Surgical Procedure: Laparoscopic Sleeve Gastrectomy, upper endoscopy  Discharge Condition: Good Disposition: Home  Diet recommendation: Postoperative sleeve gastrectomy diet (liquids only)  Filed Weights   06/17/15 1026 06/19/15 1000  Weight: 140.161 kg (309 lb) 138.52 kg (305 lb 6.1 oz)     Hospital Course:  The patient was admitted for a planned laparoscopic sleeve gastrectomy. Please see operative note. Preoperatively the patient was given 5000 units of subcutaneous heparin for DVT prophylaxis. Postoperative prophylactic Lovenox dosing was started on the morning of postoperative day 1. On the morning of POD 1, the patient had had no fever, tachycardia and looked well except for the usually seen nausea.  The patient was started on ice chips and water which they tolerated. On postoperative day 2 The patient's diet was advanced to protein shakes which they also tolerated. The patient was ambulating without difficulty. Their vital signs are stable without fever or tachycardia. Their hemoglobin had remained stable. The patient was maintained on their home settings for CPAP therapy. The patient had received discharge instructions and counseling. They were deemed stable for discharge.   Discharge Instructions  Discharge Instructions    Ambulate hourly  while awake    Complete by:  As directed      Call MD for:  difficulty breathing, headache or visual disturbances    Complete by:  As directed      Call MD for:  persistant dizziness or light-headedness    Complete by:  As directed      Call MD for:  persistant nausea and vomiting    Complete by:  As directed      Call MD for:  redness, tenderness, or signs of infection (pain, swelling, redness, odor or green/yellow discharge around incision site)    Complete by:  As directed      Call MD for:  severe uncontrolled pain    Complete by:  As directed      Call MD for:  temperature >101 F    Complete by:  As directed      Diet bariatric full liquid    Complete by:  As directed      Discharge instructions    Complete by:  As directed   See bariatric discharge instructions.  Can also review our website [email or web address]     Incentive spirometry    Complete by:  As directed   Perform hourly while awake            Medication List    STOP taking these medications        cetirizine-pseudoephedrine 5-120 MG per tablet  Commonly known as:  ZYRTEC-D     doxycycline 100 MG capsule  Commonly known as:  VIBRAMYCIN     ibuprofen 600 MG tablet  Commonly known as:  ADVIL,MOTRIN     predniSONE 20 MG  tablet  Commonly known as:  DELTASONE     traMADol 50 MG tablet  Commonly known as:  ULTRAM      TAKE these medications        ondansetron 4 MG disintegrating tablet  Commonly known as:  ZOFRAN ODT  Take 1 tablet (4 mg total) by mouth every 8 (eight) hours as needed for nausea or vomiting.     oxyCODONE 5 MG/5ML solution  Commonly known as:  ROXICODONE  Take 5-10 mLs (5-10 mg total) by mouth every 4 (four) hours as needed for moderate pain or severe pain.     pantoprazole 40 MG tablet  Commonly known as:  PROTONIX  Take 1 tablet (40 mg total) by mouth daily.           Follow-up Information    Follow up with Atilano Ina, MD. Go on 06/27/2015.   Specialty:  General Surgery    Why:  For Post-Op Check at 1:45pm   Contact information:   [address] [place] [address] [phone]        The results of significant diagnostics from this hospitalization (including imaging, microbiology, ancillary and laboratory) are listed below for reference.    Significant Diagnostic Studies: No results found.  Labs: Basic Metabolic Panel:  Recent Labs Lab 06/13/15 1440 06/18/15 0548  NA 137 137  K 3.6 4.4  CL 104 104  CO2 26 25  GLUCOSE 83 132*  BUN 9 7  CREATININE 0.81 0.65  CALCIUM 9.0 9.0   Liver Function Tests:  Recent Labs Lab 06/13/15 1440 06/18/15 0548  AST 20 65*  ALT 15 59*  ALKPHOS 85 78  BILITOT 0.6 0.1*  PROT 7.8 7.2  ALBUMIN 4.1 3.6    CBC:  Recent Labs Lab 06/13/15 1440 06/17/15 1625 06/18/15 0548 06/18/15 1625 06/19/15 0600  WBC 10.4  --  14.2*  --  12.7*  NEUTROABS 5.7  --  11.4*  --  8.6*  HGB 11.9* 11.5* 11.3* 11.6* 11.0*  HCT 36.9 35.1* 34.3* 35.5* 33.9*  MCV 86.8  --  86.6  --  86.9  PLT 430*  --  419*  --  358    CBG: No results for input(s): GLUCAP in the last 168 hours.  Principal Problem:   Morbid obesity with BMI of 50.0-59.9, adult Active Problems:   S/P laparoscopic sleeve gastrectomy   OSA on CPAP   Bilateral chronic knee pain   Time coordinating discharge: 15 min  Signed:  Atilano Ina, MD [facility], [place] [phone] 06/19/2015, 12:21 PM

## 2015-06-19 NOTE — Progress Notes (Signed)
Patient alert and oriented, pain is controlled. Patient is tolerating fluids,  advanced to protein shake today, patient tolerated well.  Reviewed Gastric sleeve discharge instructions with patient and patient is able to articulate understanding.  Provided information on BELT program, Support Group and [facility] outpatient pharmacy. All questions answered, will continue to monitor.  

## 2015-06-24 ENCOUNTER — Ambulatory Visit: Payer: 59 | Admitting: Dietician

## 2015-06-24 ENCOUNTER — Telehealth (HOSPITAL_COMMUNITY): Payer: Self-pay

## 2015-06-24 NOTE — Telephone Encounter (Signed)

## 2015-06-28 ENCOUNTER — Ambulatory Visit: Payer: 59 | Admitting: Dietician

## 2015-07-01 ENCOUNTER — Encounter: Payer: 59 | Attending: General Surgery

## 2015-07-01 DIAGNOSIS — Z713 Dietary counseling and surveillance: Secondary | ICD-10-CM | POA: Insufficient documentation

## 2015-07-01 DIAGNOSIS — Z6841 Body Mass Index (BMI) 40.0 and over, adult: Secondary | ICD-10-CM | POA: Insufficient documentation

## 2015-07-01 NOTE — Progress Notes (Signed)
Bariatric Class:  Appt start time: 1530 end time:  1630.  2 Week Post-Operative Nutrition Class  Patient was seen on 07/01/2015 for Post[facility]Operative Nutrition [facility].   Surgery date: 06/17/2015 Surgery type: Sleeve Gastrectomy Start weight at [facility]: 311 lbs on 12/21/14 Weight today: 289.5 lbs  Weight change: 21 lbs  TANITA  BODY COMP RESULTS  06/02/15 07/01/15   BMI (kg/m^2) 58.7 54.7   Fat Mass (lbs) 178.5 164.5   Fat Free Mass (lbs) 132 125.0   Total Body Water (lbs) 96.5 91.5    The following the learning objectives were met by the patient during this course:  Identifies Phase 3A (Soft, High Proteins) Dietary Goals and will begin from 2 weeks post-operatively to 2 months post-operatively  Identifies appropriate sources of fluids and proteins   States protein recommendations and appropriate sources post-operatively  Identifies the need for appropriate texture modifications, mastication, and bite sizes when consuming solids  Identifies appropriate multivitamin and calcium sources post-operatively  Describes the need for physical activity post-operatively and will follow MD recommendations  States when to call healthcare provider regarding medication questions or post-operative complications  Handouts given during class include:  Phase 3A: Soft, High Protein Diet Handout  Follow-Up Plan: Patient will follow-up at [facility] in 6 weeks for 2 month post-op nutrition visit for diet advancement per MD.

## 2015-08-14 ENCOUNTER — Encounter: Payer: 59 | Attending: General Surgery | Admitting: Dietician

## 2015-08-14 ENCOUNTER — Encounter: Payer: Self-pay | Admitting: Dietician

## 2015-08-14 VITALS — Ht 61.0 in | Wt 272.5 lb

## 2015-08-14 DIAGNOSIS — Z713 Dietary counseling and surveillance: Secondary | ICD-10-CM | POA: Insufficient documentation

## 2015-08-14 DIAGNOSIS — Z6841 Body Mass Index (BMI) 40.0 and over, adult: Secondary | ICD-10-CM | POA: Insufficient documentation

## 2015-08-14 NOTE — Patient Instructions (Addendum)
Goals:  Follow Phase 3B: High Protein + Non-Starchy Vegetables  Eat 3-6 small meals/snacks, every 3-5 hrs  Try adding a protein snack in the afternoon or evening (string cheese, deli meat, tuna/chicken salad)  Increase lean protein foods to meet 60g goal  Increase fluid intake to 64oz +  Avoid drinking 15 minutes before, during and 30 minutes after eating  Aim for >30 min of physical activity daily  Try Citracal Petites (3 per day)   Surgery date: 06/17/2015 Surgery type: Sleeve Gastrectomy [facility]: 311 lbs on 12/21/14 Weight today: 272.5 lbs Weight change: 17 lbs Total weight lost: 38.5 lbs  TANITA  BODY COMP RESULTS  06/02/15 07/01/15 08/14/15   BMI (kg/m^2) 58.7 54.7 51.5   Fat Mass (lbs) 178.5 164.5 147   Fat Free Mass (lbs) 132 125.0 125.5   Total Body Water (lbs) 96.5 91.5 92

## 2015-08-14 NOTE — Progress Notes (Signed)
  Follow-up visit:  8 Weeks Post-Operative Sleeve Gastrectomy Surgery  Medical Nutrition Therapy:  Appt start time: 0915 end time:  0945.  Primary concerns today: Post-operative Bariatric Surgery Nutrition Management.  Laura GauzeReandra returns today having lost a total of 38.5 lbs. She feels like her weight loss is not as fast as she would like it to be. Having trouble meeting fluid and protein needs but states that this has been improving lately. Tolerating all recommended foods.   Surgery date: 06/17/2015 Surgery type: Sleeve Gastrectomy [facility]: [facility] lbs on 12/21/14 Weight today: 272.5 lbs Weight change: 17 lbs Total weight lost: 38.5 lbs  TANITA  BODY COMP RESULTS  06/02/15 07/01/15 08/14/15   BMI (kg/m^2) 58.7 54.7 51.5   Fat Mass (lbs) 178.5 164.5 147   Fat Free Mass (lbs) 132 125.0 125.5   Total Body Water (lbs) 96.5 91.5 92    Preferred Learning Style:   No preference indicated   Learning Readiness:   Ready  24-hr recall: B (AM): 3 slices bacon (6g) Snk (AM): 8oz protein shake (28g)  L (PM): 1.5 boiled eggs (10g)  Snk (PM):  D (PM): 3-4 baked chicken drumsticks (14g) Snk (PM):   Fluid intake: 48 oz of water, 8 oz protein shake Estimated total protein intake: 52-58g   Medications: none Supplementation: Calcium chews cause nausea; taking MVI and B12  Using straws: yes, not causing gas pain and helps her meet fluid needs Drinking while eating: no Hair loss: unknown Carbonated beverages: tried some, caused burning N/V/D/C: constipation, taking stool softeners  Dumping syndrome: none  Recent physical activity:  Dance and zumba classes (2-3 days a week)  Progress Towards Goal(s):  In progress.  Handouts given during visit include:  Phase 3B lean protein + non starchy vegetables   Nutritional Diagnosis:  [place]-3.3 Overweight/obesity related to past poor dietary habits and physical inactivity as evidenced by patient w/ recent sleeve gastrectomy surgery  following dietary guidelines for continued weight loss.     Intervention:  Nutrition counseling provided. Goals:  Follow Phase 3B: High Protein + Non-Starchy Vegetables  Eat 3-6 small meals/snacks, every 3-5 hrs  Try adding a protein snack in the afternoon or evening (string cheese, deli meat, tuna/chicken salad)  Increase lean protein foods to meet 60g goal  Increase fluid intake to 64oz +  Avoid drinking 15 minutes before, during and 30 minutes after eating  Aim for >30 min of physical activity daily  Try Citracal Petites (3 per day)  Teaching Method Utilized:  Visual Auditory Hands on  Barriers to learning/adherence to lifestyle change: none  Demonstrated degree of understanding via:  Teach Back   Monitoring/Evaluation:  Dietary intake, exercise, and body weight. Follow up in 6 weeks for 3.5 month post-op visit.

## 2015-09-25 ENCOUNTER — Encounter: Payer: 59 | Attending: General Surgery | Admitting: Dietician

## 2015-09-25 ENCOUNTER — Encounter: Payer: Self-pay | Admitting: Dietician

## 2015-09-25 VITALS — Ht 61.0 in | Wt 256.0 lb

## 2015-09-25 DIAGNOSIS — Z6841 Body Mass Index (BMI) 40.0 and over, adult: Secondary | ICD-10-CM | POA: Insufficient documentation

## 2015-09-25 DIAGNOSIS — Z713 Dietary counseling and surveillance: Secondary | ICD-10-CM | POA: Diagnosis not present

## 2015-09-25 NOTE — Progress Notes (Signed)
  Follow-up visit:  3 months Post-Operative Sleeve Gastrectomy Surgery  Medical Nutrition Therapy:  Appt start time: 0910 end time:  930  Primary concerns today: Post-operative Bariatric Surgery Nutrition Management.  Laura Ponce returns today having lost another 17 lbs. Still feels like her weight loss is slow. Has noticed that she is able to eat more lately. Has not had any vomiting. Does not get sick if she has foods that are not recommended. Able to tolerate about 4 oz of meat at a time.   Surgery date: 06/17/2015 Surgery type: Sleeve Gastrectomy [facility]: 311 lbs on 12/21/14 Weight today: 256 lbs Weight change: 16.5 lbs Total weight lost: 55 lbs  TANITA  BODY COMP RESULTS  06/02/15 07/01/15 08/14/15 09/25/15   BMI (kg/m^2) 58.7 54.7 51.5 48.4   Fat Mass (lbs) 178.5 164.5 147 132.5   Fat Free Mass (lbs) 132 125.0 125.5 123.5   Total Body Water (lbs) 96.5 91.5 92 90.5    Preferred Learning Style:   No preference indicated   Learning Readiness:   Ready  24-hr recall: B (AM): sometimes Quest protein shake (28g) Snk (AM): sausage link and egg with cheese (10g)  L (PM): burger patty with cheese Snk (PM):  D (PM): burger patty with cheese OR BBQ pork chop (28g) Snk (PM):   Fluid intake: 48 oz of water, 8 oz protein shake Estimated total protein intake: 70g   Medications: none Supplementation: taking  Using straws: yes, not causing gas pain and helps her meet fluid needs Drinking while eating: no Hair loss: unknown Carbonated beverages: none N/V/D/C: none Dumping syndrome: none  Recent physical activity:  Inconsistent lately  Progress Towards Goal(s):  In progress.   Nutritional Diagnosis:  [place]-3.3 Overweight/obesity related to past poor dietary habits and physical inactivity as evidenced by patient w/ recent sleeve gastrectomy surgery following dietary guidelines for continued weight loss.     Intervention:  Nutrition counseling provided.  Teaching Method  Utilized:  Visual Auditory Hands on  Barriers to learning/adherence to lifestyle change: none  Demonstrated degree of understanding via:  Teach Back   Monitoring/Evaluation:  Dietary intake, exercise, and body weight. Follow up in 3 months for 6 month post-op visit.

## 2015-09-25 NOTE — Patient Instructions (Addendum)
Goals:  Follow Phase 3B: High Protein + Non-Starchy Vegetables  Eat 3-6 small meals/snacks, every 3-5 hrs  Try adding a protein snack in the afternoon or evening (string cheese, deli meat, tuna/chicken salad)  Increase lean protein foods to meet 60g goal  Increase fluid intake to 64oz +  Avoid drinking 15 minutes before, during and 30 minutes after eating  Aim for >30 min of physical activity daily  Make sure ground beef is lean (93/7)   TANITA  BODY COMP RESULTS  06/02/15 07/01/15 08/14/15 09/25/15   BMI (kg/m^2) 58.7 54.7 51.5 48.4   Fat Mass (lbs) 178.5 164.5 147 132.5   Fat Free Mass (lbs) 132 125.0 125.5 123.5   Total Body Water (lbs) 96.5 91.5 92 90.5

## 2015-12-25 ENCOUNTER — Ambulatory Visit: Payer: 59 | Admitting: Dietician

## 2016-01-28 ENCOUNTER — Encounter: Payer: Self-pay | Admitting: Dietician

## 2016-01-28 ENCOUNTER — Encounter: Payer: 59 | Attending: General Surgery | Admitting: Dietician

## 2016-01-28 DIAGNOSIS — Z029 Encounter for administrative examinations, unspecified: Secondary | ICD-10-CM | POA: Diagnosis not present

## 2016-01-28 NOTE — Progress Notes (Signed)
  Follow-up visit:  7.5 months Post-Operative Sleeve Gastrectomy Surgery  Medical Nutrition Therapy:  Appt start time: 1000 end time: 1025  Primary concerns today: Post-operative Bariatric Surgery Nutrition Management.  Laura Ponce returns today having lost another 29 lbs. Still feels like her weight loss is slow. Weighs herself only on Sunday mornings. Still tolerates all foods and sometimes has carbs like macaroni and cheese. Patient recognizes that she needs to increase fluid intake and exercise.  Surgery date: 06/17/2015 Surgery type: Sleeve Gastrectomy [facility] lbs on 12/21/14 (316 lbs per patient) Weight today: 227 lbs Weight change: 29 lbs Total weight lost: 89 lbs  Goal weight: less than 200 pounds by wedding on July 7  TANITA  BODY COMP RESULTS  06/02/15 07/01/15 08/14/15 09/25/15 01/28/16   BMI (kg/m^2) 58.7 54.7 51.5 48.4 42.9   Fat Mass (lbs) 178.5 164.5 147 132.5 111.5   Fat Free Mass (lbs) 132 125.0 125.5 123.5 115.5   Total Body Water (lbs) 96.5 91.5 92 90.5 84.5    Preferred Learning Style:   No preference indicated   Learning Readiness:   Ready  24-hr recall: B (AM): 2 scoops Whey protein shake with skim milk (60g) Snk (AM): Malaw[place]urkey meat with cheese (14g) L (PM): chicken, bacon, mushrooms and cheese (14g) Snk (PM): cheese (7g) D (PM): hot dog with chili and cheese OR crockpot chicken and cream of mushroom soup (21g) Snk (PM): sometimes cheese (7g)  Fluid intake: 34-42 oz of water with sugar free flavoring, 8 oz protein shake Estimated total protein intake: 120g  Medications: none Supplementation: taking, Calcium inconsistent  Using straws: "not really" Drinking while eating: no but sometimes drinks afterward Hair loss: yes Carbonated beverages: none N/V/D/C: none Dumping syndrome: none  Recent physical activity:  none  Progress Towards Goal(s):  In progress.   Nutritional Diagnosis:  [place]-3.3 Overweight/obesity related to past poor  dietary habits and physical inactivity as evidenced by patient w/ recent sleeve gastrectomy surgery following dietary guidelines for continued weight loss.     Intervention:  Nutrition counseling provided.  Teaching Method Utilized:  Visual Auditory Hands on  Barriers to learning/adherence to lifestyle change: none  Demonstrated degree of understanding via:  Teach Back   Monitoring/Evaluation:  Dietary intake, exercise, and body weight. Follow up in 2.5 months for 10 month post-op visit.

## 2016-01-28 NOTE — Patient Instructions (Addendum)
Goals:  Follow Phase 3B: High Protein + Non-Starchy Vegetables  Eat 3-6 small meals/snacks, every 3-5 hrs  Reduce protein powder to 1 scoop at a time  Increase fluid intake to 64oz +  Avoid drinking 15 minutes before, during and 30 minutes after eating  Aim for >30 min of physical activity daily  Find an accountabilibuddy to help motivate you to go to zumba  Eat protein first, then vegetables, then 1/2 a cup or less of starch   Limit portions of almonds to 10-12 a day  Surgery date: 06/17/2015 Surgery type: Sleeve Gastrectomy [facility] lbs on 12/21/14 (316 lbs per patient) Weight today: 227 lbs Weight change: 29 lbs Total weight lost: 89 lbs  Goal weight: less than 200 pounds by wedding on July 7  TANITA  BODY COMP RESULTS  06/02/15 07/01/15 08/14/15 09/25/15 01/28/16   BMI (kg/m^2) 58.7 54.7 51.5 48.4 42.9   Fat Mass (lbs) 178.5 164.5 147 132.5 111.5   Fat Free Mass (lbs) 132 125.0 125.5 123.5 115.5   Total Body Water (lbs) 96.5 91.5 92 90.5 84.5

## 2016-04-15 ENCOUNTER — Ambulatory Visit: Payer: 59 | Admitting: Dietician

## 2016-05-18 ENCOUNTER — Ambulatory Visit: Payer: 59 | Admitting: Dietician

## 2017-04-07 ENCOUNTER — Encounter: Payer: Self-pay | Admitting: Registered"

## 2017-04-07 ENCOUNTER — Encounter: Payer: 59 | Attending: General Surgery | Admitting: Registered"

## 2017-04-07 DIAGNOSIS — Z6841 Body Mass Index (BMI) 40.0 and over, adult: Secondary | ICD-10-CM | POA: Diagnosis not present

## 2017-04-07 DIAGNOSIS — E669 Obesity, unspecified: Secondary | ICD-10-CM

## 2017-04-07 DIAGNOSIS — Z713 Dietary counseling and surveillance: Secondary | ICD-10-CM | POA: Diagnosis present

## 2017-04-07 NOTE — Patient Instructions (Addendum)
-   Aim to inrease fluid intake to at least 64 oz a day. Use 16 oz cups refill as needed to complete 4 rounds.   - 3 Ca supplements per day   - Increase protein intake to at least 60g of protein/day  - Track food and drink using [employer] App

## 2017-04-07 NOTE — Progress Notes (Signed)
Follow-up visit:  22 Months Post-Operative Sleeve Gastrectomy Surgery  Medical Nutrition Therapy:  Appt start time: 4:22 end time:  5:20.  Primary concerns today: Post-operative Bariatric Surgery Nutrition Management.  Non scale victories: none stated  Surgery date: 06/17/2015 Surgery type: Sleeve gastrectomy [facility] Weight today: 208.4 lbs Weight change: 18.6 lbs from 227 lbs on 01/28/2016 Total weight lost: 102.639. lbs Weight loss goal: 170 lbs   TANITA  BODY COMP RESULTS  06/02/15 07/01/15 08/14/15 09/25/15 01/28/16 04/07/2017   BMI (kg/m^2) 58.7 54.7 51.5 48.4 42.9 39.4   Fat Mass (lbs) 178.5 164.5 147 132.5 111.5 84.8   Fat Free Mass (lbs) 132 125.0 125.5 123.5 115.5 123.6   Total Body Water (lbs) 96.5 91.5 92 90.5 84.5 89.6   Pt sees a Systems analystpersonal trainer.   Preferred Learning Style:   No preference indicated   Learning Readiness:   Ready  Change in progress  24-hr recall: B (AM): 2 beef hot dogs w/ cheese (10g) Snk (AM):  Breakfast bar (3g), baby bell cheese (4g) L (PM): oatmeal (7g), cinnamon, apple, boiled egg (6g) Snk (PM): peanut butter crackers (4g) D (PM): 2 beef hot dogs (10g) Snk (PM): none  Fluid intake: water; averages 32oz Estimated total protein intake: about 44g  Medications: See list Supplementation: B12, Bariatric Adv; not taking Ca supplements  Using straws: yes Drinking while eating: yes Having you been chewing well: yes Chewing/swallowing difficulties: sometimes food feels stuck Changes in vision: no Changes to mood/headaches: no Hair loss/Changes to skin/Changes to nails: some hair loss Any difficulty focusing or concentrating: no Sweating: no Dizziness/Lightheaded: sometimes Palpitations:  no Carbonated beverages: yes, carbonated water N/V/D/C/GAS: gas sometimes Abdominal Pain: no Dumping syndrome: no Last Lap-Band fill: N/A  Recent physical activity: 1 hr 3 days/wk cardio and strength training  Progress  Towards Goal(s):  In progress.  Handouts given during visit include:  Bariatric Post-op Protein Snack options   Nutritional Diagnosis:  Inadequate fluid intake As related to bariatric surgery post-op recommendations.  As evidenced by dietart recall of less than 64 fluid ounces.     Intervention:  Nutrition education and counseling.  Goals: - Aim to inrease fluid intake to at least 64 oz a day. Use 16 oz cups refill as needed to complete 4 rounds.  - 3 Ca supplements per day  - Increase protein intake to at least 60g of protein/day - Track food and drink using [employer] App  Teaching Method Utilized:  Visual Auditory Hands on  Barriers to learning/adherence to lifestyle change: none  Demonstrated degree of understanding via:  Teach Back   Monitoring/Evaluation:  Dietary intake, exercise, and body weight. Follow up in 2 months for 24 month post-op visit.

## 2017-06-15 ENCOUNTER — Ambulatory Visit: Payer: 59 | Admitting: Registered"

## 2018-01-10 ENCOUNTER — Other Ambulatory Visit: Payer: Self-pay | Admitting: Nurse Practitioner

## 2018-01-10 ENCOUNTER — Other Ambulatory Visit (HOSPITAL_COMMUNITY)
Admission: RE | Admit: 2018-01-10 | Discharge: 2018-01-10 | Disposition: A | Discharge status: Home / Self Care | Payer: 59 | Source: Ambulatory Visit | Attending: Nurse Practitioner | Admitting: Nurse Practitioner

## 2018-01-10 DIAGNOSIS — Z01419 Encounter for gynecological examination (general) (routine) without abnormal findings: Secondary | ICD-10-CM | POA: Diagnosis present

## 2018-01-12 LAB — CYTOLOGY - PAP
CHLAMYDIA, DNA PROBE: NEGATIVE
DIAGNOSIS: NEGATIVE
HPV: NOT DETECTED
NEISSERIA GONORRHEA: NEGATIVE

## 2019-05-22 ENCOUNTER — Encounter: Payer: Self-pay | Admitting: Pediatrics

## 2019-05-22 ENCOUNTER — Ambulatory Visit (INDEPENDENT_AMBULATORY_CARE_PROVIDER_SITE_OTHER): Payer: Commercial Managed Care - PPO | Admitting: Pediatrics

## 2019-05-22 ENCOUNTER — Other Ambulatory Visit: Payer: Self-pay

## 2019-05-22 VITALS — BP 130/72 | HR 76 | Temp 97.5°F | Resp 16 | Ht 61.0 in | Wt 250.0 lb

## 2019-05-22 DIAGNOSIS — J301 Allergic rhinitis due to pollen: Secondary | ICD-10-CM | POA: Diagnosis not present

## 2019-05-22 DIAGNOSIS — L508 Other urticaria: Secondary | ICD-10-CM

## 2019-05-22 DIAGNOSIS — R5383 Other fatigue: Secondary | ICD-10-CM | POA: Diagnosis not present

## 2019-05-22 DIAGNOSIS — L500 Allergic urticaria: Secondary | ICD-10-CM | POA: Diagnosis not present

## 2019-05-22 MED ORDER — FAMOTIDINE 20 MG PO TABS: ORAL_TABLET | ORAL | 5 refills | Status: DC

## 2019-05-22 NOTE — Progress Notes (Signed)
[address] [place] [address] Dept: [phone]  New Patient Note  Patient ID: Laura Ponce, female    DOB: 07-22-1986  Age: 33 y.o. MRN: [id number] Date of Office Visit: 05/22/2019 Referring provider: Kathyrn Lass, MD [place],  [place] [address]    Chief Complaint: Urticaria  HPI Laura Ponce presents for hives for  6 months.  The hives come and go and may last between 2 hours and 3 days.  She has not had tick bites.  Once she had mild swelling of her tongue but no swelling of her lips or eyes or throat.  She does not have a history of eczema or thyroid disease.  There is no history of asthma She has not had hives prior to the last 6 months.  She has not had fever, body aches or swelling of her joints.  She had a gastric sleeve done 4 years ago  Review of Systems  Constitutional: Negative.   HENT: Negative.   Eyes: Negative.   Respiratory: Negative.   Cardiovascular: Negative.   Gastrointestinal:       Gastric sleeve 4 years ago  Genitourinary: Negative.   Musculoskeletal: Negative.   Skin:       Hives off and on for 6 months  Neurological: Negative.   Endo/Heme/Allergies:       No diabetes or thyroid disease  Psychiatric/Behavioral: Negative.     Outpatient Encounter Medications as of 05/22/2019  Medication Sig  . escitalopram (LEXAPRO) 10 MG tablet Take 10 mg by mouth daily.  Marland Kitchen loratadine (CLARITIN) 10 MG tablet Take 10 mg by mouth daily.  . famotidine (PEPCID) 20 MG tablet 1 tablet twice a day  . [DISCONTINUED] ondansetron (ZOFRAN ODT) 4 MG disintegrating tablet Take 1 tablet (4 mg total) by mouth every 8 (eight) hours as needed for nausea or vomiting. (Patient not taking: Reported on 08/14/2015)  . [DISCONTINUED] oxyCODONE (ROXICODONE) 5 MG/5ML solution Take 5-10 mLs (5-10 mg total) by mouth every 4 (four) hours as needed for moderate pain or severe pain. (Patient not taking: Reported on 08/14/2015)  . [DISCONTINUED] pantoprazole (PROTONIX) 40  MG tablet Take 1 tablet (40 mg total) by mouth daily. (Patient not taking: Reported on 08/14/2015)   No facility-administered encounter medications on file as of 05/22/2019.      Drug Allergies:  No Known Allergies  Family History: Malik's family history is not on file..  Family history is positive for food allergies.  Family history is negative for asthma, hayfever, sinus problems, angioedema, eczema, hives, lupus.  Social and environmental.  There are no pets in the home.  She is not exposed to cigarette smoking.  She has not smoked cigarettes in the past.  She works indoors as a Education officer, museum  Physical Exam: BP 130/72 (BP Location: Right Arm, Patient Position: Sitting, Cuff Size: Large)   Pulse 76   Temp (!) 97.5 F (36.4 C) (Temporal)   Resp 16   Ht 5\' 1"  (1.549 m)   Wt 250 lb (113.4 kg)   SpO2 97%   BMI 47.24 kg/m    Physical Exam Vitals signs reviewed.  Constitutional:      Appearance: Normal appearance. She is obese.  HENT:     Head:     Comments: Eyes normal.  Ears normal.  Nose normal.  Pharynx normal. Neck:     Musculoskeletal: Neck supple.  Cardiovascular:     Comments: S1-S2 normal no murmurs Pulmonary:     Comments: Clear to percussion  and auscultation Abdominal:     Palpations: Abdomen is soft.     Tenderness: There is no abdominal tenderness.     Comments: No hepatosplenomegaly  Lymphadenopathy:     Cervical: No cervical adenopathy.  Skin:    Comments: Clear but she had dermographia noted  Neurological:     General: No focal deficit present.     Mental Status: She is alert and oriented to person, place, and time. Mental status is at baseline.  Psychiatric:        Mood and Affect: Mood normal.        Behavior: Behavior normal.        Thought Content: Thought content normal.        Judgment: Judgment normal.     Diagnostics:  Allergy skin test showed some reactivity to tomato..  She had positive skin test to grass pollens which can cross-react  with tomato  Assessment  Assessment and Plan: 1. Fatigue, unspecified type   2. Chronic urticaria   3. Allergic urticaria   4. Seasonal allergic rhinitis due to pollen     Meds ordered this encounter  Medications  . famotidine (PEPCID) 20 MG tablet    Sig: 1 tablet twice a day    Dispense:  60 tablet    Refill:  5    Pt will call    Patient Instructions  Zyrtec 10 mg-take 1 tablet once a day for itching but you may take 1 tablet twice a day if needed If the hives are not well controlled, add famotidine 20 mg - 1 tablet twice a day Avoid large amounts of tomato.   Do foods with salicylates make you itch? I will call you with the results of your lab work Call me if you are not doing well on this treatment plan   Return in about 4 weeks (around 06/19/2019).   Thank you for the opportunity to care for this patient.  Please do not hesitate to contact me with questions.  Tonette BihariJ. A. Bardelas, M.D.  [facility] [address] [place], [place] [address] [phone]

## 2019-05-22 NOTE — Patient Instructions (Signed)
Zyrtec 10 mg-take 1 tablet once a day for itching but you may take 1 tablet twice a day if needed If the hives are not well controlled, add famotidine 20 mg - 1 tablet twice a day Avoid large amounts of tomato.   Do foods with salicylates make you itch? I will call you with the results of your lab work Call me if you are not doing well on this treatment plan

## 2020-11-24 ENCOUNTER — Other Ambulatory Visit: Payer: Self-pay | Admitting: Family Medicine

## 2020-11-24 DIAGNOSIS — R110 Nausea: Secondary | ICD-10-CM

## 2020-12-04 ENCOUNTER — Ambulatory Visit
Admission: RE | Admit: 2020-12-04 | Discharge: 2020-12-04 | Disposition: A | Discharge status: Home / Self Care | Payer: 59 | Source: Ambulatory Visit | Attending: Family Medicine | Admitting: Family Medicine

## 2020-12-04 DIAGNOSIS — R110 Nausea: Secondary | ICD-10-CM

## 2020-12-08 ENCOUNTER — Other Ambulatory Visit: Payer: Self-pay | Admitting: Family Medicine

## 2020-12-08 DIAGNOSIS — R160 Hepatomegaly, not elsewhere classified: Secondary | ICD-10-CM

## 2020-12-27 ENCOUNTER — Other Ambulatory Visit: Payer: Self-pay

## 2020-12-27 ENCOUNTER — Ambulatory Visit
Admission: RE | Admit: 2020-12-27 | Discharge: 2020-12-27 | Disposition: A | Discharge status: Home / Self Care | Payer: Managed Care, Other (non HMO) | Source: Ambulatory Visit | Attending: Family Medicine | Admitting: Family Medicine

## 2020-12-27 DIAGNOSIS — R160 Hepatomegaly, not elsewhere classified: Secondary | ICD-10-CM

## 2020-12-27 MED ORDER — GADOBENATE DIMEGLUMINE 529 MG/ML IV SOLN
20.0000 mL | Freq: Once | INTRAVENOUS | Status: AC | PRN
  Administered 2020-12-27: 20 mL via INTRAVENOUS

## 2021-02-03 ENCOUNTER — Institutional Professional Consult (permissible substitution): Payer: 59 | Admitting: Plastic Surgery

## 2021-02-07 ENCOUNTER — Encounter (INDEPENDENT_AMBULATORY_CARE_PROVIDER_SITE_OTHER): Payer: Self-pay

## 2021-02-23 ENCOUNTER — Other Ambulatory Visit (HOSPITAL_COMMUNITY): Payer: Self-pay

## 2021-02-23 MED ORDER — ESCITALOPRAM OXALATE 20 MG PO TABS
20.0000 mg | ORAL_TABLET | Freq: Every day | ORAL | 5 refills | Status: DC
  Filled 2021-02-23: qty 30, 30d supply, fill #0
  Filled 2021-04-24: qty 30, 30d supply, fill #1
  Filled 2021-06-12: qty 30, 30d supply, fill #2
  Filled 2021-07-21: qty 30, 30d supply, fill #3
  Filled 2021-09-04: qty 30, 30d supply, fill #4
  Filled 2021-10-08: qty 30, 30d supply, fill #5

## 2021-02-23 MED ORDER — BUPROPION HCL ER (XL) 150 MG PO TB24
150.0000 mg | ORAL_TABLET | Freq: Every morning | ORAL | 0 refills | Status: DC
  Filled 2021-02-23: qty 60, 60d supply, fill #0

## 2021-02-25 ENCOUNTER — Other Ambulatory Visit (HOSPITAL_COMMUNITY): Payer: Self-pay

## 2021-03-11 ENCOUNTER — Ambulatory Visit (INDEPENDENT_AMBULATORY_CARE_PROVIDER_SITE_OTHER): Payer: No Typology Code available for payment source | Admitting: Family Medicine

## 2021-03-11 ENCOUNTER — Other Ambulatory Visit: Payer: Self-pay

## 2021-03-11 ENCOUNTER — Encounter (INDEPENDENT_AMBULATORY_CARE_PROVIDER_SITE_OTHER): Payer: Self-pay | Admitting: Family Medicine

## 2021-03-11 VITALS — BP 111/73 | HR 74 | Temp 97.8°F | Ht 60.0 in | Wt 229.0 lb

## 2021-03-11 DIAGNOSIS — F32A Depression, unspecified: Secondary | ICD-10-CM

## 2021-03-11 DIAGNOSIS — Z9989 Dependence on other enabling machines and devices: Secondary | ICD-10-CM

## 2021-03-11 DIAGNOSIS — R0602 Shortness of breath: Secondary | ICD-10-CM | POA: Diagnosis not present

## 2021-03-11 DIAGNOSIS — R7303 Prediabetes: Secondary | ICD-10-CM | POA: Diagnosis not present

## 2021-03-11 DIAGNOSIS — F419 Anxiety disorder, unspecified: Secondary | ICD-10-CM

## 2021-03-11 DIAGNOSIS — G4733 Obstructive sleep apnea (adult) (pediatric): Secondary | ICD-10-CM

## 2021-03-11 DIAGNOSIS — Z6841 Body Mass Index (BMI) 40.0 and over, adult: Secondary | ICD-10-CM

## 2021-03-11 DIAGNOSIS — Z9884 Bariatric surgery status: Secondary | ICD-10-CM

## 2021-03-11 DIAGNOSIS — Z0289 Encounter for other administrative examinations: Secondary | ICD-10-CM

## 2021-03-11 DIAGNOSIS — R5383 Other fatigue: Secondary | ICD-10-CM

## 2021-03-11 DIAGNOSIS — Z1331 Encounter for screening for depression: Secondary | ICD-10-CM

## 2021-03-11 DIAGNOSIS — Z9189 Other specified personal risk factors, not elsewhere classified: Secondary | ICD-10-CM

## 2021-03-12 LAB — CBC WITH DIFFERENTIAL/PLATELET
Basophils Absolute: 0.1 10*3/uL (ref 0.0–0.2)
Basos: 1 %
EOS (ABSOLUTE): 0.2 10*3/uL (ref 0.0–0.4)
Eos: 4 %
Hematocrit: 37 % (ref 34.0–46.6)
Hemoglobin: 11.7 g/dL (ref 11.1–15.9)
Immature Grans (Abs): 0 10*3/uL (ref 0.0–0.1)
Immature Granulocytes: 0 %
Lymphocytes Absolute: 2.4 10*3/uL (ref 0.7–3.1)
Lymphs: 41 %
MCH: 27.9 pg (ref 26.6–33.0)
MCHC: 31.6 g/dL (ref 31.5–35.7)
MCV: 88 fL (ref 79–97)
Monocytes Absolute: 0.4 10*3/uL (ref 0.1–0.9)
Monocytes: 6 %
Neutrophils Absolute: 2.8 10*3/uL (ref 1.4–7.0)
Neutrophils: 48 %
Platelets: 368 10*3/uL (ref 150–450)
RBC: 4.19 x10E6/uL (ref 3.77–5.28)
RDW: 13.1 % (ref 11.7–15.4)
WBC: 5.8 10*3/uL (ref 3.4–10.8)

## 2021-03-12 LAB — COMPREHENSIVE METABOLIC PANEL
ALT: 11 IU/L (ref 0–32)
AST: 18 IU/L (ref 0–40)
Albumin/Globulin Ratio: 1.6 (ref 1.2–2.2)
Albumin: 4.4 g/dL (ref 3.8–4.8)
Alkaline Phosphatase: 92 IU/L (ref 44–121)
BUN/Creatinine Ratio: 7 — ABNORMAL LOW (ref 9–23)
BUN: 6 mg/dL (ref 6–20)
Bilirubin Total: 0.3 mg/dL (ref 0.0–1.2)
CO2: 21 mmol/L (ref 20–29)
Calcium: 9.3 mg/dL (ref 8.7–10.2)
Chloride: 100 mmol/L (ref 96–106)
Creatinine, Ser: 0.84 mg/dL (ref 0.57–1.00)
Globulin, Total: 2.8 g/dL (ref 1.5–4.5)
Glucose: 80 mg/dL (ref 65–99)
Potassium: 4.6 mmol/L (ref 3.5–5.2)
Sodium: 137 mmol/L (ref 134–144)
Total Protein: 7.2 g/dL (ref 6.0–8.5)
eGFR: 93 mL/min/{1.73_m2} (ref >59)

## 2021-03-12 LAB — LIPID PANEL WITH LDL/HDL RATIO
Cholesterol, Total: 187 mg/dL (ref 100–199)
HDL: 46 mg/dL (ref >39)
LDL Chol Calc (NIH): 130 mg/dL — ABNORMAL HIGH (ref 0–99)
LDL/HDL Ratio: 2.8 ratio (ref 0.0–3.2)
Triglycerides: 58 mg/dL (ref 0–149)
VLDL Cholesterol Cal: 11 mg/dL (ref 5–40)

## 2021-03-12 LAB — T4: T4, Total: 7.4 ug/dL (ref 4.5–12.0)

## 2021-03-12 LAB — VITAMIN B12: Vitamin B-12: >2000 pg/mL — ABNORMAL HIGH (ref 232–1245)

## 2021-03-12 LAB — INSULIN, RANDOM: INSULIN: 7.5 u[IU]/mL (ref 2.6–24.9)

## 2021-03-12 LAB — T3: T3, Total: 99 ng/dL (ref 71–180)

## 2021-03-12 LAB — VITAMIN D 25 HYDROXY (VIT D DEFICIENCY, FRACTURES): Vit D, 25-Hydroxy: 61 ng/mL (ref 30.0–100.0)

## 2021-03-12 LAB — FOLATE: Folate: 11.3 ng/mL (ref >3.0)

## 2021-03-12 LAB — HEMOGLOBIN A1C
Est. average glucose Bld gHb Est-mCnc: 105 mg/dL
Hgb A1c MFr Bld: 5.3 % (ref 4.8–5.6)

## 2021-03-12 LAB — TSH: TSH: 2.06 u[IU]/mL (ref 0.450–4.500)

## 2021-03-19 NOTE — Progress Notes (Signed)
Dear Dr. Hyacinth Ponce,   Thank you for referring Laura Ponce to our clinic. The following note includes my evaluation and treatment recommendations.  Chief Complaint:   OBESITY Laura Ponce (MR# [id number]) is a 35 y.o. female who presents for evaluation and treatment of obesity and related comorbidities. Current BMI is Body mass index is 44.72 kg/m. Laura Ponce has been struggling with her weight for many years and has been unsuccessful in either losing weight, maintaining weight loss, or reaching her healthy weight goal.  Laura Ponce is currently in the action stage of change and ready to dedicate time achieving and maintaining a healthier weight. Laura Ponce is interested in becoming our patient and working on intensive lifestyle modifications including (but not limited to) diet and exercise for weight loss.  Laura Ponce was referred by Dr. Hyacinth Ponce. She has history of gastric sleeve in 2016- not much restriction left. Medicine and 1 bottle of water. 1 cup oatmeal, 1/2 apple, 1 banana, cinnamon (satisfied); 12-1 PM Lunch- lentil soup- normandy blend vegetables, rice, cauliflower, beef broth (2 cups all together), 1/2 cup sweet potato with cinnamon (full); coffee with sugar free creamer and splenda; ~4 PM- cabbage and mushrooms with vinegar and 6 sugar free wafers (routine); 7:30 PM- banana.  Laura Ponce habits were reviewed today and are as follows: her desired weight loss is 69 lbs, she has been heavy most of her life, she started gaining weight in elementary school, her heaviest weight ever was 309 pounds, she has significant food cravings issues, she is frequently drinking liquids with calories, she frequently makes poor food choices, she frequently eats larger portions than normal, she has binge eating behaviors and she struggles with emotional eating.  Depression Screen Laura Ponce Food and Mood (modified PHQ-9) score was 11.  Depression screen PHQ 2/9 03/11/2021  Decreased Interest 1  Down, Depressed,  Hopeless 1  PHQ - 2 Score 2  Altered sleeping 2  Tired, decreased energy 3  Change in appetite 3  Feeling bad or failure about yourself  1  Trouble concentrating 0  Moving slowly or fidgety/restless 0  Suicidal thoughts 0  PHQ-9 Score 11  Difficult doing work/chores Not difficult at all   Subjective:   1. Other fatigue Laura Ponce admits to daytime somnolence and admits to waking up still tired. Patent has a history of symptoms of daytime fatigue, morning fatigue and morning headache. Laura Ponce generally gets 8 hours of sleep per night, and states that she has poor sleep quality. Snoring is present. Apneic episodes are not present. Epworth Sleepiness Score is 6. EKG normal sinus rhythm at 75 bpm.  2. Shortness of breath on exertion Laura Ponce notes increasing shortness of breath with exercising and seems to be worsening over time with weight gain. She notes getting out of breath sooner with activity than she used to. This has gotten worse recently. Laura Ponce denies shortness of breath at rest or orthopnea. EKG normal sinus rhythm at 75 bpm.  3. Anxiety and depression Laura Ponce has been on Lexapro since 20/20 and Wellbutrin ~2 months ago.  Pt denies suicidal or homicidal ideations. Recent discussion of increasing Wellbutrin.  4. Pre-diabetes Historical diagnosis. Laura Ponce is unclear what A1c was.  She is not on medication.  5. OSA on CPAP Laura Ponce had a CPAP but has not worn it since gastric sleeve.  6. S/P laparoscopic sleeve gastrectomy Laura Ponce had surgery in 2016. Not much restriction left.  7. At risk for deficient intake of food Laura Ponce is at risk for deficient intake of food due to  gastric sleeve.  Assessment/Plan:   1. Other fatigue Laura Ponce does feel that her weight is causing her energy to be lower than it should be. Fatigue may be related to obesity, depression or many other causes. Labs will be ordered, and in the meanwhile, Laura Ponce will focus on self care including making healthy  food choices, increasing physical activity and focusing on stress reduction. Check labs today.  - Vitamin B12 - EKG 12-Lead - Folate - TSH - T4 - T3 - VITAMIN D 25 Hydroxy (Vit-D Deficiency, Fractures)  2. Shortness of breath on exertion Laura Ponce does feel that she gets out of breath more easily that she used to when she exercises. Laura Ponce's shortness of breath appears to be obesity related and exercise induced. She has agreed to work on weight loss and gradually increase exercise to treat her exercise induced shortness of breath. Will continue to monitor closely. Check labs today.  - CBC with Differential/Platelet - Lipid Panel With LDL/HDL Ratio  3. Anxiety and depression Behavior modification techniques were discussed today to help Laura Ponce deal with her anxiety.  Orders and follow up as documented in patient record. Behavior modification techniques were discussed today to help Laura Ponce deal with her emotional/non-hunger eating behaviors.  Orders and follow up as documented in patient record.  -Follow up on dosage at next appt.  4. Pre-diabetes Laura Ponce will continue to work on weight loss, exercise, and decreasing simple carbohydrates to help decrease the risk of diabetes.  - Comprehensive metabolic panel - Hemoglobin A1c - Insulin, random  5. OSA on CPAP Intensive lifestyle modifications are the first line treatment for this issue. We discussed several lifestyle modifications today and she will continue to work on diet, exercise and weight loss efforts. We will continue to monitor. Orders and follow up as documented in patient record.  -Follow up at upcoming appts.  6. S/P laparoscopic sleeve gastrectomy Check labs today. - Albumin  7. Screening for depression Laura Ponce had a positive depression screening. Depression is commonly associated with obesity and often results in emotional eating behaviors. We will monitor this closely and work on CBT to help improve the non-hunger eating  patterns. Referral to Psychology may be required if no improvement is seen as she continues in our clinic.  8. At risk for deficient intake of food Laura Ponce was given approximately 15 minutes of deficit intake of food prevention counseling today. Amantha is at risk for eating too few calories based on current food recall. She was encouraged to focus on meeting caloric and protein goals according to her recommended meal plan.   9. Class 3 severe obesity with serious comorbidity and body mass index (BMI) of 40.0 to 44.9 in adult, unspecified obesity type (HCC) Laura Ponce is currently in the action stage of change and her goal is to continue with weight loss efforts. I recommend Laura Ponce begin the structured treatment plan as follows:  She has agreed to the Category 2 Plan + 100 calories.  Exercise goals: As is.   Behavioral modification strategies: increasing lean protein intake, meal planning and cooking strategies, keeping healthy foods in the home and planning for success.  She was informed of the importance of frequent follow-up visits to maximize her success with intensive lifestyle modifications for her multiple health conditions. She was informed we would discuss her lab results at her next visit unless there is a critical issue that needs to be addressed sooner. Laura Ponce agreed to keep her next visit at the agreed upon time to discuss these results.  Objective:   Blood pressure 111/73, pulse 74, temperature 97.8 F (36.6 C), height 5' (1.524 m), weight 229 lb (103.9 kg), SpO2 98 %. Body mass index is 44.72 kg/m.  EKG: Normal sinus rhythm, rate 75.  Indirect Calorimeter completed today shows a VO2 of 228 and a REE of 1589.  Her calculated basal metabolic rate is ? thus her basal metabolic rate is ? than expected.  General: Cooperative, alert, well developed, in no acute distress. HEENT: Conjunctivae and lids unremarkable. Cardiovascular: Regular rhythm.  Lungs: Normal work of  breathing. Neurologic: No focal deficits.   Lab Results  Component Value Date   CREATININE 0.84 03/11/2021   BUN 6 03/11/2021   NA 137 03/11/2021   K 4.6 03/11/2021   CL 100 03/11/2021   CO2 21 03/11/2021   Lab Results  Component Value Date   ALT 11 03/11/2021   AST 18 03/11/2021   ALKPHOS 92 03/11/2021   BILITOT 0.3 03/11/2021   Lab Results  Component Value Date   HGBA1C 5.3 03/11/2021   Lab Results  Component Value Date   INSULIN 7.5 03/11/2021   Lab Results  Component Value Date   TSH 2.060 03/11/2021   Lab Results  Component Value Date   CHOL 187 03/11/2021   HDL 46 03/11/2021   LDLCALC 130 (H) 03/11/2021   TRIG 58 03/11/2021   Lab Results  Component Value Date   WBC 5.8 03/11/2021   HGB 11.7 03/11/2021   HCT 37.0 03/11/2021   MCV 88 03/11/2021   PLT 368 03/11/2021    Attestation Statements:   Reviewed by clinician on day of visit: allergies, medications, problem list, medical history, surgical history, family history, social history, and previous encounter notes.  Edmund Hilda, CMA, am acting as transcriptionist for Reuben Likes, MD.   I have reviewed the above documentation for accuracy and completeness, and I agree with the above. - Katherina Mires, MD

## 2021-03-25 ENCOUNTER — Ambulatory Visit (INDEPENDENT_AMBULATORY_CARE_PROVIDER_SITE_OTHER): Payer: Commercial Managed Care - PPO | Admitting: Family Medicine

## 2021-03-26 ENCOUNTER — Ambulatory Visit (INDEPENDENT_AMBULATORY_CARE_PROVIDER_SITE_OTHER): Payer: No Typology Code available for payment source | Admitting: Family Medicine

## 2021-04-06 ENCOUNTER — Other Ambulatory Visit (HOSPITAL_COMMUNITY): Payer: Self-pay

## 2021-04-06 MED ORDER — AMOXICILLIN-POT CLAVULANATE 875-125 MG PO TABS
1.0000 | ORAL_TABLET | Freq: Two times a day (BID) | ORAL | 0 refills | Status: DC
  Filled 2021-04-06: qty 20, 10d supply, fill #0

## 2021-04-24 ENCOUNTER — Other Ambulatory Visit (HOSPITAL_COMMUNITY): Payer: Self-pay

## 2021-04-24 MED ORDER — BUPROPION HCL ER (XL) 150 MG PO TB24
150.0000 mg | ORAL_TABLET | Freq: Every morning | ORAL | 2 refills | Status: DC
  Filled 2021-04-24: qty 90, 90d supply, fill #0
  Filled 2021-07-21: qty 90, 90d supply, fill #1
  Filled 2021-11-12: qty 90, 90d supply, fill #2

## 2021-05-12 ENCOUNTER — Ambulatory Visit (INDEPENDENT_AMBULATORY_CARE_PROVIDER_SITE_OTHER): Payer: No Typology Code available for payment source | Admitting: Plastic Surgery

## 2021-05-12 ENCOUNTER — Other Ambulatory Visit: Payer: Self-pay

## 2021-05-12 DIAGNOSIS — M542 Cervicalgia: Secondary | ICD-10-CM | POA: Diagnosis not present

## 2021-05-12 DIAGNOSIS — N62 Hypertrophy of breast: Secondary | ICD-10-CM | POA: Insufficient documentation

## 2021-05-12 DIAGNOSIS — G8929 Other chronic pain: Secondary | ICD-10-CM

## 2021-05-12 DIAGNOSIS — M549 Dorsalgia, unspecified: Secondary | ICD-10-CM | POA: Insufficient documentation

## 2021-05-12 DIAGNOSIS — M546 Pain in thoracic spine: Secondary | ICD-10-CM | POA: Diagnosis not present

## 2021-05-12 NOTE — Progress Notes (Addendum)
Patient ID: Laura Ponce, female    DOB: 24-Nov-1985, 34 y.o.   MRN: [id number]   Chief Complaint  Patient presents with   Advice Only    Mammary Hyperplasia: The patient is a 35 y.o. female with a history of mammary hyperplasia for several years.  She has extremely large breasts causing symptoms that include the following: Back pain in the upper and lower back, including neck pain. She pulls or pins her bra straps to provide better lift and relief of the pressure and pain. She notices relief by holding her breast up manually.  Her shoulder straps cause grooves and pain and pressure that requires padding for relief. Pain medication is sometimes required with motrin and tylenol.  Activities that are hindered by enlarged breasts include: exercise and running.  She has tried supportive clothing as well as fitted bras without improvement.  Her breasts are extremely large and fairly symmetric.  She has hyperpigmentation of the inframammary area on both sides.  The sternal to nipple distance on the right is 42 cm and the left is 42 cm.  The IMF distance is 13 cm.  She is 5 feet 1 inches tall and weighs 245 pounds.  The BMI = 46.3.  Preoperative bra size = J cup. She would like to be a DD cup. The estimated excess breast tissue to be removed at the time of surgery = 620 grams on the left and 620 grams on the right.  Mammogram history: none.  Family history of breast cancer:  none.  Tobacco use:  none.   The patient expresses the desire to pursue surgical intervention.  She underwent a gastric sleeve in 2016.  At that time she was over 300 pounds.  She has been able to keep her weight down to her current size.   Review of Systems  Constitutional:  Positive for activity change. Negative for appetite change.  Eyes: Negative.   Respiratory: Negative.  Negative for chest tightness and shortness of breath.   Cardiovascular: Negative.   Gastrointestinal: Negative.   Endocrine: Negative.    Genitourinary: Negative.   Musculoskeletal:  Positive for back pain and neck pain.  Skin:  Positive for rash. Negative for pallor and wound.  Hematological: Negative.   Psychiatric/Behavioral: Negative.     Past Medical History:  Diagnosis Date   Anxiety    Depression    Hypertension    Other fatigue    Pneumonia    YEARS AGO   Prediabetes    Shortness of breath on exertion    Sleep apnea    Urticaria     Past Surgical History:  Procedure Laterality Date   BREATH TEK H PYLORI N/A 12/17/2014   Procedure: BREATH TEK H PYLORI;  Surgeon: Atilano Ina, MD;  Location: [facility];  Service: General;  Laterality: N/A;   LAPAROSCOPIC GASTRIC SLEEVE RESECTION N/A 06/17/2015   Procedure: LAPAROSCOPIC GASTRIC SLEEVE RESECTION UPPER ENDOSCOPY;  Surgeon: Gaynelle Adu, MD;  Location: [facility];  Service: General;  Laterality: N/A;   UPPER GI ENDOSCOPY  06/17/2015   Procedure: UPPER GI ENDOSCOPY;  Surgeon: Gaynelle Adu, MD;  Location: [facility];  Service: General;;      Current Outpatient Medications:    buPROPion (WELLBUTRIN XL) 150 MG 24 hr tablet, Take 1 tablet (150 mg total) by mouth in the morning., Disp: 90 tablet, Rfl: 2   cetirizine (ZYRTEC) 10 MG tablet, Take 10 mg by mouth as needed for allergies., Disp: , Rfl:  Cyanocobalamin (VITAMIN B12) 1000 MCG TBCR, 1 tablet, Disp: , Rfl:    escitalopram (LEXAPRO) 20 MG tablet, Take 1 tablet (20 mg total) by mouth daily., Disp: 30 tablet, Rfl: 5   famotidine (PEPCID) 20 MG tablet, Take 20 mg by mouth as needed for heartburn or indigestion., Disp: , Rfl:    Multiple Vitamin (MULTIVITAMIN ADULT) TABS, Take 1 tablet by mouth daily., Disp: , Rfl:    Objective:   Vitals:   05/19/21 1058  BP: 120/70  Resp: 18  Temp: 98.2 F (36.8 C)  SpO2: 98%    Physical Exam Vitals and nursing note reviewed.  Constitutional:      Appearance: Normal appearance.  HENT:     Head: Normocephalic and atraumatic.  Cardiovascular:     Rate and Rhythm: Normal  rate.     Pulses: Normal pulses.  Pulmonary:     Effort: Pulmonary effort is normal. No respiratory distress.     Breath sounds: No wheezing.  Abdominal:     General: Abdomen is flat. There is no distension.     Tenderness: There is no abdominal tenderness.  Skin:    General: Skin is warm.     Capillary Refill: Capillary refill takes less than 2 seconds.  Neurological:     General: No focal deficit present.     Mental Status: She is alert and oriented to person, place, and time.  Psychiatric:        Mood and Affect: Mood normal.        Behavior: Behavior normal.        Thought Content: Thought content normal.    Assessment & Plan:  Symptomatic mammary hypertrophy  Chronic bilateral thoracic back pain  Neck pain  The procedure the patient selected and that was best for the patient was discussed. The risk were discussed and include but not limited to the following:  Breast asymmetry, fluid accumulation, firmness of the breast, inability to breast feed, loss of nipple or areola, skin loss, change in skin and nipple sensation, fat necrosis of the breast tissue, bleeding, infection and healing delay.  There are risks of anesthesia and injury to nerves or blood vessels.  Allergic reaction to tape, suture and skin glue are possible.  There will be swelling.  Any of these can lead to the need for revisional surgery.  A breast reduction has potential to interfere with diagnostic procedures in the future.  This procedure is best done when the breast is fully developed.  Changes in the breast will continue to occur over time: pregnancy, weight gain or weigh loss.    Total time: 45 minutes. This includes time spent with the patient during the visit as well as time spent before and after the visit reviewing the chart, documenting the encounter and ordering pertinent studies. and literature emailed to the patient.   Physical therapy:  ordered Mammogram:  ordered Healthy Weight and Wellness:   recommend  The patient is a good candidate for bilateral breast reduction with possible liposuction.  I do think she should decrease her weight a little bit prior to surgery.  She may need the amputation technique.   Pictures were obtained of the patient and placed in the chart with the patient's or guardian's permission.   Alena Bills Jemmie Ledgerwood, DO

## 2021-06-08 ENCOUNTER — Other Ambulatory Visit: Payer: Self-pay | Admitting: Surgical

## 2021-06-08 DIAGNOSIS — N62 Hypertrophy of breast: Secondary | ICD-10-CM

## 2021-06-12 ENCOUNTER — Other Ambulatory Visit (HOSPITAL_COMMUNITY): Payer: Self-pay

## 2021-07-21 ENCOUNTER — Other Ambulatory Visit (HOSPITAL_COMMUNITY): Payer: Self-pay

## 2021-08-05 ENCOUNTER — Ambulatory Visit
Admission: RE | Admit: 2021-08-05 | Discharge: 2021-08-05 | Disposition: A | Discharge status: Home / Self Care | Payer: No Typology Code available for payment source | Source: Ambulatory Visit | Attending: Surgical | Admitting: Surgical

## 2021-08-05 ENCOUNTER — Other Ambulatory Visit: Payer: Self-pay

## 2021-08-05 DIAGNOSIS — N62 Hypertrophy of breast: Secondary | ICD-10-CM

## 2021-08-26 ENCOUNTER — Other Ambulatory Visit: Payer: Self-pay

## 2021-08-26 ENCOUNTER — Ambulatory Visit: Payer: No Typology Code available for payment source | Attending: Plastic Surgery

## 2021-08-26 DIAGNOSIS — M542 Cervicalgia: Secondary | ICD-10-CM | POA: Diagnosis present

## 2021-08-26 DIAGNOSIS — M6281 Muscle weakness (generalized): Secondary | ICD-10-CM | POA: Diagnosis present

## 2021-08-26 DIAGNOSIS — R293 Abnormal posture: Secondary | ICD-10-CM | POA: Diagnosis present

## 2021-08-26 DIAGNOSIS — M546 Pain in thoracic spine: Secondary | ICD-10-CM | POA: Diagnosis present

## 2021-08-26 NOTE — Therapy (Signed)
[facility] [address] [place], [place], [address] Phone: [phone]   Fax:  [phone]  Physical Therapy Evaluation  Patient Details  Name: Laura Ponce MRN: [id number] Date of Birth: 1985/12/18 Referring Provider (PT): Peggye Form, DO   Encounter Date: 08/26/2021   PT End of Session - 08/26/21 1731     Visit Number 1    Number of Visits 6    Date for PT Re-Evaluation 10/17/21    Authorization Type [facility]    PT Start Time 1700    PT Stop Time 1730    PT Time Calculation (min) 30 min    Activity Tolerance Patient tolerated treatment well    Behavior During Therapy [facility] for tasks assessed/performed             Past Medical History:  Diagnosis Date   Anxiety    Depression    Hypertension    Other fatigue    Pneumonia    YEARS AGO   Prediabetes    Shortness of breath on exertion    Sleep apnea    Urticaria     Past Surgical History:  Procedure Laterality Date   BREATH TEK H PYLORI N/A 12/17/2014   Procedure: BREATH TEK Richardo Priest;  Surgeon: Atilano Ina, MD;  Location: [facility];  Service: General;  Laterality: N/A;   LAPAROSCOPIC GASTRIC SLEEVE RESECTION N/A 06/17/2015   Procedure: LAPAROSCOPIC GASTRIC SLEEVE RESECTION UPPER ENDOSCOPY;  Surgeon: Gaynelle Adu, MD;  Location: [facility];  Service: General;  Laterality: N/A;   UPPER GI ENDOSCOPY  06/17/2015   Procedure: UPPER GI ENDOSCOPY;  Surgeon: Gaynelle Adu, MD;  Location: [facility];  Service: General;;    There were no vitals filed for this visit.    Subjective Assessment - 08/26/21 1701     Subjective Pt is a pleasant 35 y/o F who presents to PT with reports of mid and lower back pain secondary to large breast size.    Limitations Sitting    How long can you sit comfortably? 2 min    How long can you stand comfortably? 2-5 min    How long can you walk comfortably? indefinite    Patient Stated Goals pt would like to decrease upper and mid back  pain and improve posture    Currently in Pain? Yes    Pain Score 3    7/10 at worst   Pain Location Back    Pain Orientation Mid;Upper    Pain Descriptors / Indicators Aching    Pain Type Chronic pain    Pain Radiating Towards mid back    Aggravating Factors  sitting unsupported, lying flat    Pain Relieving Factors heat, massage                [facility] PT Assessment - 08/26/21 0001       Assessment   Medical Diagnosis N62 (ICD-10-CM) - Hypertrophy of breast  M54.6 (ICD-10-CM) - Pain in thoracic spine  G89.29 (ICD-10-CM) - Other chronic pain  M54.2 (ICD-10-CM) - Cervicalgia    Referring Provider (PT) Dillingham, Alena Bills, DO      Precautions   Precautions None      Restrictions   Weight Bearing Restrictions No      Balance Screen   Has the patient fallen in the past 6 months Yes    How many times? a few - with no injuries    Has the patient had a decrease in activity level  because of a fear of falling?  No    Is the patient reluctant to leave their home because of a fear of falling?  No      Home Environment   Living Environment Private residence    Living Arrangements Spouse/significant other      Prior Function   Level of Independence Independent;Independent with basic ADLs    Vocation Full time employment    Mudlogger at [employer]      Observation/Other Assessments   Other Surveys  Oswestry Disability Index      Posture/Postural Control   Posture Comments fwd head, rounded shoulders, increased thoracic kyphosis      ROM / Strength   AROM / PROM / Strength Strength      Strength   Overall Strength Comments mid and lower trap: 3+/5 bilat      Palpation   Palpation comment TTP to bilateral upper trapezius, R>L                        Objective measurements completed on examination: See above findings.                PT Education - 08/26/21 1731     Education Details eval findings, HEP, POC     Person(s) Educated Patient    Methods Explanation;Demonstration;Handout    Comprehension Verbalized understanding;Returned demonstration              PT Short Term Goals - 08/26/21 1732       PT SHORT TERM GOAL #1   Title Pt will be compliant and knowledgeable with initial HEP for improved comfort and carryover    Baseline initial HEP given    Time 3    Period Weeks    Status New    Target Date 09/16/21               PT Long Term Goals - 08/26/21 1732       PT LONG TERM GOAL #1   Title Pt will decrease ODI score to no greater than 35% disability as proxy for functional improvement    Baseline 56% disability    Time 8    Period Weeks    Status New    Target Date 10/17/21      PT LONG TERM GOAL #2   Title Pt will self report upper and mid back pain no greater than 3/10 at worst for improved comfort and function    Baseline 7/10 at worst    Time 8    Period Weeks    Status New    Target Date 10/17/21      PT LONG TERM GOAL #3   Title Pt will improve sitting tolerance to no less than 20 minutes before increase in upper/mid back pain    Baseline 2-4min    Time 8    Period Weeks    Status New    Target Date 10/17/21                    Plan - 08/26/21 1736     Clinical Impression Statement Pt is a pleasant 35 y/o F who presents to PT with reports of chronic upper and mid back pain secondary to large breast size. She has many postural impairments and palpable pain in upper trapezius, as well as hypomobile thoracic spine. Her ODI indicates severe disability in the functioning of home ADLs and community participation, indicating  she is operating below her PLOF. Pt would benefit from skilled PT services working to improve periscapular strength and reduce soft tissue pain.    Personal Factors and Comorbidities Fitness;Time since onset of injury/illness/exacerbation    Examination-Activity Limitations Reach Overhead;Sit;Squat;Stairs;Stand;Lift     Examination-Participation Restrictions Occupation;Community Activity;Volunteer;Yard Work    Conservation officer, historic buildings Stable/Uncomplicated    Optometrist Low    Rehab Potential Fair    PT Frequency 1x / week    PT Duration 6 weeks    PT Treatment/Interventions ADLs/Self Care Home Management;Electrical Stimulation;Moist Heat;Cryotherapy;Gait training;Stair training;Functional mobility training;Therapeutic activities;Therapeutic exercise;Neuromuscular re-education;Patient/family education;Manual techniques;Dry needling;Taping;Vasopneumatic Device    PT Next Visit Plan assess response to HEP; progress as able    PT Home Exercise Plan Access Code: [email or web address]    Consulted and Agree with Plan of Care Patient             Patient will benefit from skilled therapeutic intervention in order to improve the following deficits and impairments:  Decreased activity tolerance, Decreased endurance, Decreased mobility, Hypomobility, Postural dysfunction, Pain  Visit Diagnosis: Cervicalgia  Muscle weakness (generalized)  Abnormal posture  Pain in thoracic spine     Problem List Patient Active Problem List   Diagnosis Date Noted   Symptomatic mammary hypertrophy 05/12/2021   Back pain 05/12/2021   Neck pain 05/12/2021   S/P laparoscopic sleeve gastrectomy 06/17/2015   OSA on CPAP 06/17/2015   Morbid obesity with BMI of 50.0-59.9, adult (HCC) 06/17/2015   Bilateral chronic knee pain 06/17/2015    Eloy End, PT 08/26/2021, 5:44 PM  [facility] [address] [place], [place], [address] Phone: [phone]   Fax:  [phone]  Name: Laura Ponce MRN: [id number] Date of Birth: 07-28-1986

## 2021-09-04 ENCOUNTER — Ambulatory Visit: Payer: No Typology Code available for payment source | Admitting: Physical Therapy

## 2021-09-04 ENCOUNTER — Encounter: Payer: Self-pay | Admitting: Physical Therapy

## 2021-09-04 ENCOUNTER — Other Ambulatory Visit: Payer: Self-pay

## 2021-09-04 ENCOUNTER — Other Ambulatory Visit (HOSPITAL_COMMUNITY): Payer: Self-pay

## 2021-09-04 DIAGNOSIS — M542 Cervicalgia: Secondary | ICD-10-CM | POA: Diagnosis not present

## 2021-09-04 DIAGNOSIS — M6281 Muscle weakness (generalized): Secondary | ICD-10-CM

## 2021-09-04 DIAGNOSIS — R293 Abnormal posture: Secondary | ICD-10-CM

## 2021-09-04 DIAGNOSIS — M546 Pain in thoracic spine: Secondary | ICD-10-CM

## 2021-09-04 NOTE — Therapy (Signed)
[facility] Outpatient Rehabilitation [facility] [address] [place], [place], [address] Phone: [phone]   Fax:  [phone]  Physical Therapy Treatment  Patient Details  Name: Laura Ponce MRN: [id number] Date of Birth: 01-15-1986 Referring Provider (PT): Peggye Form, DO   Encounter Date: 09/04/2021   PT End of Session - 09/04/21 0728     Visit Number 2    Number of Visits 6    Date for PT Re-Evaluation 10/17/21    Authorization Type [facility]    PT Start Time 0722    PT Stop Time 0755    PT Time Calculation (min) 33 min             Past Medical History:  Diagnosis Date   Anxiety    Depression    Hypertension    Other fatigue    Pneumonia    YEARS AGO   Prediabetes    Shortness of breath on exertion    Sleep apnea    Urticaria     Past Surgical History:  Procedure Laterality Date   BREATH TEK H PYLORI N/A 12/17/2014   Procedure: BREATH TEK Richardo Priest;  Surgeon: Atilano Ina, MD;  Location: [facility];  Service: General;  Laterality: N/A;   LAPAROSCOPIC GASTRIC SLEEVE RESECTION N/A 06/17/2015   Procedure: LAPAROSCOPIC GASTRIC SLEEVE RESECTION UPPER ENDOSCOPY;  Surgeon: Gaynelle Adu, MD;  Location: [facility];  Service: General;  Laterality: N/A;   UPPER GI ENDOSCOPY  06/17/2015   Procedure: UPPER GI ENDOSCOPY;  Surgeon: Gaynelle Adu, MD;  Location: [facility];  Service: General;;    There were no vitals filed for this visit.   Subjective Assessment - 09/04/21 0728     Subjective Upper back and shoulder pain this morning , 2-3/10 now.    Currently in Pain? Yes    Pain Score 3     Pain Location Back    Pain Orientation Mid;Upper    Pain Descriptors / Indicators Aching    Pain Type Chronic pain    Aggravating Factors  sitting unsupported, lying flat    Pain Relieving Factors heat, massage             Therapeutic Exercise: UBE 2 min each way Blue and row 10 x 3 Red band extension 10 x 3 Doorway stretch 3 x 30 sec Standing  green band star pattern x 15 Open books x 5 each            PT Short Term Goals - 08/26/21 1732       PT SHORT TERM GOAL #1   Title Pt will be compliant and knowledgeable with initial HEP for improved comfort and carryover    Baseline initial HEP given    Time 3    Period Weeks    Status New    Target Date 09/16/21               PT Long Term Goals - 08/26/21 1732       PT LONG TERM GOAL #1   Title Pt will decrease ODI score to no greater than 35% disability as proxy for functional improvement    Baseline 56% disability    Time 8    Period Weeks    Status New    Target Date 10/17/21      PT LONG TERM GOAL #2   Title Pt will self report upper and mid back pain no greater than 3/10 at worst for improved comfort and function  Baseline 7/10 at worst    Time 8    Period Weeks    Status New    Target Date 10/17/21      PT LONG TERM GOAL #3   Title Pt will improve sitting tolerance to no less than 20 minutes before increase in upper/mid back pain    Baseline 2-7min    Time 8    Period Weeks    Status New    Target Date 10/17/21                   Plan - 09/04/21 1112     Clinical Impression Statement Pt reports low level pain on arrival and compliance with HEP. Reviewed HEP and progressed with additional scapular strenghening.  Pt tolerated session well wihtout increased pain.    PT Treatment/Interventions ADLs/Self Care Home Management;Electrical Stimulation;Moist Heat;Cryotherapy;Gait training;Stair training;Functional mobility training;Therapeutic activities;Therapeutic exercise;Neuromuscular re-education;Patient/family education;Manual techniques;Dry needling;Taping;Vasopneumatic Device    PT Next Visit Plan assess response to HEP; progress as able    PT Home Exercise Plan Access Code: [email or web address]    Consulted and Agree with Plan of Care Patient             Patient will benefit from skilled therapeutic intervention in order to improve the  following deficits and impairments:  Decreased activity tolerance, Decreased endurance, Decreased mobility, Hypomobility, Postural dysfunction, Pain  Visit Diagnosis: Cervicalgia  Muscle weakness (generalized)  Abnormal posture  Pain in thoracic spine     Problem List Patient Active Problem List   Diagnosis Date Noted   Symptomatic mammary hypertrophy 05/12/2021   Back pain 05/12/2021   Neck pain 05/12/2021   S/P laparoscopic sleeve gastrectomy 06/17/2015   OSA on CPAP 06/17/2015   Morbid obesity with BMI of 50.0-59.9, adult (HCC) 06/17/2015   Bilateral chronic knee pain 06/17/2015    Sherrie Mustache, PTA 09/04/2021, 11:14 AM  [facility] [address] [place], [place], [address] Phone: [phone]   Fax:  [phone]  Name: Kathlene Yano MRN: [id number] Date of Birth: 02-Dec-1985

## 2021-09-07 ENCOUNTER — Other Ambulatory Visit: Payer: Self-pay | Admitting: Family Medicine

## 2021-09-07 DIAGNOSIS — R160 Hepatomegaly, not elsewhere classified: Secondary | ICD-10-CM

## 2021-09-09 ENCOUNTER — Ambulatory Visit: Payer: No Typology Code available for payment source | Admitting: Physical Therapy

## 2021-09-09 ENCOUNTER — Other Ambulatory Visit: Payer: Self-pay

## 2021-09-09 ENCOUNTER — Encounter: Payer: Self-pay | Admitting: Physical Therapy

## 2021-09-09 DIAGNOSIS — M542 Cervicalgia: Secondary | ICD-10-CM | POA: Diagnosis not present

## 2021-09-09 DIAGNOSIS — R293 Abnormal posture: Secondary | ICD-10-CM

## 2021-09-09 DIAGNOSIS — M6281 Muscle weakness (generalized): Secondary | ICD-10-CM

## 2021-09-09 DIAGNOSIS — M546 Pain in thoracic spine: Secondary | ICD-10-CM

## 2021-09-09 NOTE — Therapy (Signed)
[facility] Outpatient Rehabilitation [facility] [address] [place], [place], [address] Phone: [phone]   Fax:  [phone]  Physical Therapy Treatment  Patient Details  Name: Laura Ponce MRN: [id number] Date of Birth: Jun 30, 1986 Referring Provider (PT): Peggye Form, DO   Encounter Date: 09/09/2021   PT End of Session - 09/09/21 0735     Visit Number 3    Number of Visits 6    Date for PT Re-Evaluation 10/17/21    Authorization Type [facility]    PT Start Time 0730   15 min late   PT Stop Time 0800    PT Time Calculation (min) 30 min             Past Medical History:  Diagnosis Date   Anxiety    Depression    Hypertension    Other fatigue    Pneumonia    YEARS AGO   Prediabetes    Shortness of breath on exertion    Sleep apnea    Urticaria     Past Surgical History:  Procedure Laterality Date   BREATH TEK H PYLORI N/A 12/17/2014   Procedure: BREATH TEK Richardo Priest;  Surgeon: Atilano Ina, MD;  Location: [facility];  Service: General;  Laterality: N/A;   LAPAROSCOPIC GASTRIC SLEEVE RESECTION N/A 06/17/2015   Procedure: LAPAROSCOPIC GASTRIC SLEEVE RESECTION UPPER ENDOSCOPY;  Surgeon: Gaynelle Adu, MD;  Location: [facility];  Service: General;  Laterality: N/A;   UPPER GI ENDOSCOPY  06/17/2015   Procedure: UPPER GI ENDOSCOPY;  Surgeon: Gaynelle Adu, MD;  Location: [facility];  Service: General;;    There were no vitals filed for this visit.   Subjective Assessment - 09/09/21 0734     Subjective No pain this morning. Still doing the exercises and the bridges.    Currently in Pain? No/denies             Therapeutic Exercise: UBE 2 min each way Blue and row 10 x 3 Red band extension 10 x 3 Doorway stretch 3 x 30 sec Supine  green band star pattern x 15 Open books x 10 each  Bridge 10 x 2 Seated upper trap and levator stretches 30 sec x 2 each        PT Short Term Goals - 08/26/21 1732       PT SHORT TERM GOAL #1   Title  Pt will be compliant and knowledgeable with initial HEP for improved comfort and carryover    Baseline initial HEP given    Time 3    Period Weeks    Status New    Target Date 09/16/21               PT Long Term Goals - 08/26/21 1732       PT LONG TERM GOAL #1   Title Pt will decrease ODI score to no greater than 35% disability as proxy for functional improvement    Baseline 56% disability    Time 8    Period Weeks    Status New    Target Date 10/17/21      PT LONG TERM GOAL #2   Title Pt will self report upper and mid back pain no greater than 3/10 at worst for improved comfort and function    Baseline 7/10 at worst    Time 8    Period Weeks    Status New    Target Date 10/17/21      PT LONG  TERM GOAL #3   Title Pt will improve sitting tolerance to no less than 20 minutes before increase in upper/mid back pain    Baseline 2-94min    Time 8    Period Weeks    Status New    Target Date 10/17/21                   Plan - 09/09/21 0742     Clinical Impression Statement Pt reports no pain on arrival. Continued with scapular stabilization, RTC strengthening and trunk flexibility. Pt tolerated session well with min increased upper back pain during bilateral shoulder ER.    PT Treatment/Interventions ADLs/Self Care Home Management;Electrical Stimulation;Moist Heat;Cryotherapy;Gait training;Stair training;Functional mobility training;Therapeutic activities;Therapeutic exercise;Neuromuscular re-education;Patient/family education;Manual techniques;Dry needling;Taping;Vasopneumatic Device    PT Next Visit Plan assess response to HEP; progress as able    PT Home Exercise Plan Access Code: [email or web address]             Patient will benefit from skilled therapeutic intervention in order to improve the following deficits and impairments:  Decreased activity tolerance, Decreased endurance, Decreased mobility, Hypomobility, Postural dysfunction, Pain  Visit  Diagnosis: Cervicalgia  Muscle weakness (generalized)  Abnormal posture  Pain in thoracic spine     Problem List Patient Active Problem List   Diagnosis Date Noted   Symptomatic mammary hypertrophy 05/12/2021   Back pain 05/12/2021   Neck pain 05/12/2021   S/P laparoscopic sleeve gastrectomy 06/17/2015   OSA on CPAP 06/17/2015   Morbid obesity with BMI of 50.0-59.9, adult (HCC) 06/17/2015   Bilateral chronic knee pain 06/17/2015    Sherrie Mustache, PTA 09/09/2021, 7:59 AM  [facility] [address] [place], [place], [address] Phone: [phone]   Fax:  [phone]  Name: Airelle Everding MRN: [id number] Date of Birth: 1985/11/25

## 2021-09-16 ENCOUNTER — Ambulatory Visit: Payer: No Typology Code available for payment source

## 2021-09-23 ENCOUNTER — Ambulatory Visit: Payer: No Typology Code available for payment source | Attending: Plastic Surgery

## 2021-09-23 ENCOUNTER — Other Ambulatory Visit: Payer: Self-pay

## 2021-09-23 DIAGNOSIS — M6281 Muscle weakness (generalized): Secondary | ICD-10-CM | POA: Diagnosis present

## 2021-09-23 DIAGNOSIS — R293 Abnormal posture: Secondary | ICD-10-CM | POA: Diagnosis present

## 2021-09-23 DIAGNOSIS — M542 Cervicalgia: Secondary | ICD-10-CM | POA: Insufficient documentation

## 2021-09-23 DIAGNOSIS — M546 Pain in thoracic spine: Secondary | ICD-10-CM | POA: Insufficient documentation

## 2021-09-23 NOTE — Therapy (Addendum)
[facility] [address] [place], [place], [address] Phone: [phone]   Fax:  [phone]  Physical Therapy Treatment/Discharge  Patient Details  Name: Laura Ponce MRN: [id number] Date of Birth: 10/26/1985 Referring Provider (PT): Peggye Form, DO   Encounter Date: 09/23/2021   PT End of Session - 09/23/21 1704     Visit Number 4    Number of Visits 6    Date for PT Re-Evaluation 10/17/21    Authorization Type [facility]    PT Start Time 1703    PT Stop Time 1741    PT Time Calculation (min) 38 min    Activity Tolerance Patient tolerated treatment well    Behavior During Therapy [facility] for tasks assessed/performed             Past Medical History:  Diagnosis Date   Anxiety    Depression    Hypertension    Other fatigue    Pneumonia    YEARS AGO   Prediabetes    Shortness of breath on exertion    Sleep apnea    Urticaria     Past Surgical History:  Procedure Laterality Date   BREATH TEK H PYLORI N/A 12/17/2014   Procedure: BREATH TEK Richardo Priest;  Surgeon: Atilano Ina, MD;  Location: [facility];  Service: General;  Laterality: N/A;   LAPAROSCOPIC GASTRIC SLEEVE RESECTION N/A 06/17/2015   Procedure: LAPAROSCOPIC GASTRIC SLEEVE RESECTION UPPER ENDOSCOPY;  Surgeon: Gaynelle Adu, MD;  Location: [facility];  Service: General;  Laterality: N/A;   UPPER GI ENDOSCOPY  06/17/2015   Procedure: UPPER GI ENDOSCOPY;  Surgeon: Gaynelle Adu, MD;  Location: [facility];  Service: General;;    There were no vitals filed for this visit.   Subjective Assessment - 09/23/21 1705     Subjective Pt presents to PT with reports of slight pain and discomfort in mid back and neck. Has been fairly compliant with HEP with no adverse effect. Pt is ready to begin PT treatment at this time.    Currently in Pain? Yes    Pain Score 2     Pain Location Neck           [facility] Adult PT Treatment/Exercise:   Therapeutic Exercise:  UBE lvl  1.0 x 4 min (2ea fwd/bwd) Row 3x10 13lb Extension 3x10 30lbs Doorway stretch 3 x 30 sec Supine  green band star pattern x 15 Open books x 10 each  Bridge 2x10 Seated upper trap and levator stretches 30 sec x 2 each Total gym row 2x10 45lbs Lat pulldown 3x10 45lbs Thoracic ext w/ soft foam x 10   Manual Therapy: N/A   Neuromuscular re-ed: N/A   Therapeutic Activity: N/A   Modalities: N/A   Self Care: N/A   Consider / progression for next session:                                PT Short Term Goals - 08/26/21 1732       PT SHORT TERM GOAL #1   Title Pt will be compliant and knowledgeable with initial HEP for improved comfort and carryover    Baseline initial HEP given    Time 3    Period Weeks    Status New    Target Date 09/16/21               PT Long Term Goals - 08/26/21 1732  PT LONG TERM GOAL #1   Title Pt will decrease ODI score to no greater than 35% disability as proxy for functional improvement    Baseline 56% disability    Time 8    Period Weeks    Status New    Target Date 10/17/21      PT LONG TERM GOAL #2   Title Pt will self report upper and mid back pain no greater than 3/10 at worst for improved comfort and function    Baseline 7/10 at worst    Time 8    Period Weeks    Status New    Target Date 10/17/21      PT LONG TERM GOAL #3   Title Pt will improve sitting tolerance to no less than 20 minutes before increase in upper/mid back pain    Baseline 2-41min    Time 8    Period Weeks    Status New    Target Date 10/17/21                   Plan - 09/23/21 1711     Clinical Impression Statement Pt was again able to complete prescribed exercises with no adverse effect. She continues to work on improving periscapular strength core endurance. PT will continue to progress as tolerated per POC.    PT Treatment/Interventions ADLs/Self Care Home Management;Electrical Stimulation;Moist  Heat;Cryotherapy;Gait training;Stair training;Functional mobility training;Therapeutic activities;Therapeutic exercise;Neuromuscular re-education;Patient/family education;Manual techniques;Dry needling;Taping;Vasopneumatic Device    PT Next Visit Plan assess response to HEP; progress as able    PT Home Exercise Plan Access Code: [email or web address]             Patient will benefit from skilled therapeutic intervention in order to improve the following deficits and impairments:  Decreased activity tolerance, Decreased endurance, Decreased mobility, Hypomobility, Postural dysfunction, Pain  Visit Diagnosis: Cervicalgia  Muscle weakness (generalized)  Abnormal posture  Pain in thoracic spine     Problem List Patient Active Problem List   Diagnosis Date Noted   Symptomatic mammary hypertrophy 05/12/2021   Back pain 05/12/2021   Neck pain 05/12/2021   S/P laparoscopic sleeve gastrectomy 06/17/2015   OSA on CPAP 06/17/2015   Morbid obesity with BMI of 50.0-59.9, adult (HCC) 06/17/2015   Bilateral chronic knee pain 06/17/2015    Eloy End, PT 09/23/2021, 5:43 PM  [facility] [address] [place], [place], [address] Phone: [phone]   Fax:  [phone]  Name: Laura Ponce MRN: [id number] Date of Birth: 12-20-1985   PHYSICAL THERAPY DISCHARGE SUMMARY  Visits from Start of Care: 4  Current functional level related to goals / functional outcomes: Unable to assess   Remaining deficits: Unable to assess   Education / Equipment: N/A   Patient agrees to discharge. Patient goals were  unable to assess . Patient is being discharged due to not returning since the last visit.

## 2021-09-30 ENCOUNTER — Ambulatory Visit: Payer: No Typology Code available for payment source

## 2021-09-30 ENCOUNTER — Telehealth: Payer: Self-pay

## 2021-09-30 NOTE — Telephone Encounter (Signed)
PT called and left voicemail regarding missed visit.   Reminded her of attendance policy and next visit.  Eloy End, PT, DPT 09/30/21 6:08 PM

## 2021-10-07 ENCOUNTER — Ambulatory Visit: Payer: No Typology Code available for payment source | Admitting: Physical Therapy

## 2021-10-07 ENCOUNTER — Ambulatory Visit: Payer: No Typology Code available for payment source

## 2021-10-08 ENCOUNTER — Other Ambulatory Visit (HOSPITAL_COMMUNITY): Payer: Self-pay

## 2021-10-14 ENCOUNTER — Ambulatory Visit
Admission: RE | Admit: 2021-10-14 | Discharge: 2021-10-14 | Disposition: A | Discharge status: Home / Self Care | Payer: No Typology Code available for payment source | Source: Ambulatory Visit

## 2021-10-14 ENCOUNTER — Other Ambulatory Visit: Payer: Self-pay

## 2021-10-14 ENCOUNTER — Ambulatory Visit
Admission: RE | Admit: 2021-10-14 | Discharge: 2021-10-14 | Disposition: A | Discharge status: Home / Self Care | Payer: No Typology Code available for payment source | Source: Ambulatory Visit | Attending: Family Medicine | Admitting: Family Medicine

## 2021-10-14 DIAGNOSIS — R160 Hepatomegaly, not elsewhere classified: Secondary | ICD-10-CM

## 2021-10-14 MED ORDER — GADOBENATE DIMEGLUMINE 529 MG/ML IV SOLN: 20.0000 mL | Freq: Once | INTRAVENOUS | Status: DC | PRN

## 2021-10-27 ENCOUNTER — Other Ambulatory Visit: Payer: Self-pay

## 2021-10-27 ENCOUNTER — Other Ambulatory Visit: Payer: Self-pay | Admitting: Family Medicine

## 2021-10-27 DIAGNOSIS — R160 Hepatomegaly, not elsewhere classified: Secondary | ICD-10-CM

## 2021-10-27 MED ORDER — GADOBENATE DIMEGLUMINE 529 MG/ML IV SOLN
20.0000 mL | Freq: Once | INTRAVENOUS | Status: AC | PRN
  Administered 2021-10-27: 20 mL via INTRAVENOUS

## 2021-10-30 ENCOUNTER — Other Ambulatory Visit (HOSPITAL_COMMUNITY): Payer: Self-pay

## 2021-10-30 MED ORDER — BUPROPION HCL ER (XL) 150 MG PO TB24
150.0000 mg | ORAL_TABLET | Freq: Every day | ORAL | 3 refills | Status: DC
  Filled 2021-10-30: qty 90, 90d supply, fill #0

## 2021-10-30 MED ORDER — ESCITALOPRAM OXALATE 20 MG PO TABS
20.0000 mg | ORAL_TABLET | Freq: Every day | ORAL | 3 refills | Status: DC
  Filled 2021-10-30 – 2021-11-12 (×2): qty 90, 90d supply, fill #0

## 2021-11-10 ENCOUNTER — Other Ambulatory Visit (HOSPITAL_COMMUNITY): Payer: Self-pay

## 2021-11-12 ENCOUNTER — Other Ambulatory Visit (HOSPITAL_COMMUNITY): Payer: Self-pay

## 2022-01-20 ENCOUNTER — Other Ambulatory Visit (HOSPITAL_COMMUNITY): Payer: Self-pay

## 2022-01-20 MED ORDER — ONDANSETRON HCL 4 MG PO TABS
4.0000 mg | ORAL_TABLET | Freq: Three times a day (TID) | ORAL | 0 refills | Status: DC | PRN
  Filled 2022-01-20: qty 20, 4d supply, fill #0

## 2022-01-20 MED ORDER — BENZONATATE 100 MG PO CAPS
100.0000 mg | ORAL_CAPSULE | Freq: Three times a day (TID) | ORAL | 0 refills | Status: DC | PRN
  Filled 2022-01-20: qty 30, 10d supply, fill #0

## 2022-01-21 ENCOUNTER — Other Ambulatory Visit (HOSPITAL_COMMUNITY): Payer: Self-pay

## 2022-03-31 ENCOUNTER — Other Ambulatory Visit (HOSPITAL_COMMUNITY): Payer: Self-pay

## 2022-03-31 MED ORDER — BUPROPION HCL ER (XL) 150 MG PO TB24
150.0000 mg | ORAL_TABLET | Freq: Every morning | ORAL | 1 refills | Status: AC
  Filled 2022-03-31: qty 90, 90d supply, fill #0
  Filled 2022-07-16: qty 90, 90d supply, fill #1

## 2022-03-31 MED ORDER — ESCITALOPRAM OXALATE 20 MG PO TABS
20.0000 mg | ORAL_TABLET | Freq: Every day | ORAL | 1 refills | Status: DC
  Filled 2022-03-31: qty 90, 90d supply, fill #0
  Filled 2022-07-16: qty 90, 90d supply, fill #1

## 2022-04-29 ENCOUNTER — Other Ambulatory Visit: Payer: Self-pay | Admitting: Family Medicine

## 2022-04-29 DIAGNOSIS — K769 Liver disease, unspecified: Secondary | ICD-10-CM

## 2022-05-26 ENCOUNTER — Encounter (INDEPENDENT_AMBULATORY_CARE_PROVIDER_SITE_OTHER): Payer: Self-pay

## 2022-06-09 ENCOUNTER — Other Ambulatory Visit (HOSPITAL_COMMUNITY): Payer: Self-pay

## 2022-06-09 ENCOUNTER — Encounter: Payer: Self-pay | Admitting: Physician Assistant

## 2022-06-09 ENCOUNTER — Telehealth: Payer: 59 | Admitting: Physician Assistant

## 2022-06-09 DIAGNOSIS — J029 Acute pharyngitis, unspecified: Secondary | ICD-10-CM

## 2022-06-09 DIAGNOSIS — R509 Fever, unspecified: Secondary | ICD-10-CM | POA: Diagnosis not present

## 2022-06-09 DIAGNOSIS — R6889 Other general symptoms and signs: Secondary | ICD-10-CM

## 2022-06-09 DIAGNOSIS — R52 Pain, unspecified: Secondary | ICD-10-CM | POA: Diagnosis not present

## 2022-06-09 MED ORDER — OSELTAMIVIR PHOSPHATE 75 MG PO CAPS
75.0000 mg | ORAL_CAPSULE | Freq: Two times a day (BID) | ORAL | 0 refills | Status: AC
  Filled 2022-06-09: qty 10, 5d supply, fill #0

## 2022-06-09 NOTE — Patient Instructions (Signed)
Karine Ursin, thank you for joining Piedad Climes, PA-C for today's virtual visit.  While this provider is not your primary care provider (PCP), if your PCP is located in our provider database this encounter information will be shared with them immediately following your visit.  Consent: (Patient) Laura Ponce provided verbal consent for this virtual visit at the beginning of the encounter.  Current Medications:  Current Outpatient Medications:    benzonatate (TESSALON PERLES) 100 MG capsule, Take 1 capsule (100 mg total) by mouth every 8 (eight) hours as needed for dry cough, Disp: 30 capsule, Rfl: 0   buPROPion (WELLBUTRIN XL) 150 MG 24 hr tablet, Take 1 tablet (150 mg total) by mouth in the morning., Disp: 90 tablet, Rfl: 2   buPROPion (WELLBUTRIN XL) 150 MG 24 hr tablet, Take 1 tablet (150 mg total) by mouth daily in the morning., Disp: 90 tablet, Rfl: 3   buPROPion (WELLBUTRIN XL) 150 MG 24 hr tablet, Take 1 tablet (150 mg total) by mouth every morning., Disp: 90 tablet, Rfl: 1   cetirizine (ZYRTEC) 10 MG tablet, Take 10 mg by mouth as needed for allergies. (Patient not taking: Reported on 08/26/2021), Disp: , Rfl:    Cyanocobalamin (VITAMIN B12) 1000 MCG TBCR, 1 tablet, Disp: , Rfl:    escitalopram (LEXAPRO) 20 MG tablet, Take 1 tablet (20 mg total) by mouth daily., Disp: 90 tablet, Rfl: 3   escitalopram (LEXAPRO) 20 MG tablet, Take 1 tablet (20 mg total) by mouth daily., Disp: 90 tablet, Rfl: 1   famotidine (PEPCID) 20 MG tablet, Take 20 mg by mouth as needed for heartburn or indigestion. (Patient not taking: Reported on 08/26/2021), Disp: , Rfl:    Multiple Vitamin (MULTIVITAMIN ADULT) TABS, Take 1 tablet by mouth daily., Disp: , Rfl:    ondansetron (ZOFRAN) 4 MG tablet, Take 1-2 tablets (4-8 mg total) by mouth every 8 (eight) hours as needed for nausea & vomiting, Disp: 20 tablet, Rfl: 0   Medications ordered in this encounter:  No orders of the defined types were placed in this  encounter.    *If you need refills on other medications prior to your next appointment, please contact your pharmacy*  Follow-Up: Call back or seek an in-person evaluation if the symptoms worsen or if the condition fails to improve as anticipated.  Other Instructions Please keep well-hydrated and get plenty of rest. Start 1000 mg of vitamin C and 1000 units of vitamin D3 daily for immune support. Okay to continue your OTC medications. I want you to retest for COVID, either tonight or tomorrow.  Let me know the results in case we need to make any adjustments. For now, start the Tamiflu and take as directed. Need to remain home until we confirm repeat COVID test is negative, until you have been fever free for 24 hours without a fever reducing medicine, and are feeling better. I have sent a work note to Pharmacologist.  Feel better soon!   If you have been instructed to have an in-person evaluation today at a local Urgent Care facility, please use the link below. It will take you to a list of all of our available [facility], including address, phone number and hours of operation. Please do not delay care.  [facility]  If you or a family member do not have a primary care provider, use the link below to schedule a visit and establish care. When you choose a  primary care physician  or advanced practice provider, you gain a long-term partner in health. Find a Primary Care Provider  Learn more about [facility]'s in-office and virtual care options: [facility]

## 2022-06-09 NOTE — Progress Notes (Signed)
Virtual Visit Consent   Laura Ponce, you are scheduled for a virtual visit with a [facility] provider today. Just as with appointments in the office, your consent must be obtained to participate. Your consent will be active for this visit and any virtual visit you may have with one of our providers in the next 365 days. If you have a MyChart account, a copy of this consent can be sent to you electronically.  As this is a virtual visit, video technology does not allow for your provider to perform a traditional examination. This may limit your provider's ability to fully assess your condition. If your provider identifies any concerns that need to be evaluated in person or the need to arrange testing (such as labs, EKG, etc.), we will make arrangements to do so. Although advances in technology are sophisticated, we cannot ensure that it will always work on either your end or our end. If the connection with a video visit is poor, the visit may have to be switched to a telephone visit. With either a video or telephone visit, we are not always able to ensure that we have a secure connection.  By engaging in this virtual visit, you consent to the provision of healthcare and authorize for your insurance to be billed (if applicable) for the services provided during this visit. Depending on your insurance coverage, you may receive a charge related to this service.  I need to obtain your verbal consent now. Are you willing to proceed with your visit today? Laura Ponce has provided verbal consent on 06/09/2022 for a virtual visit (video or telephone). Laura Ponce, [place]  Date: 06/09/2022 1:08 PM  Virtual Visit via Video Note   I, Laura Ponce, connected with  Laura Ponce  ([id number], [birth year]) on 06/09/22 at 12:30 PM EDT by a video-enabled telemedicine application and verified that I am speaking with the correct person using two identifiers.  Location: Patient: Virtual Visit Location  Patient: Home Provider: Virtual Visit Location Provider: Home Office   I discussed the limitations of evaluation and management by telemedicine and the availability of in person appointments. The patient expressed understanding and agreed to proceed.    History of Present Illness: Laura Ponce is a 36 y.o. who identifies as a female who was assigned female at birth, and is being seen today for abrupt onset Monday of chills, aches, sore throat and fever with Tmax 102.9. No chest congestion or cough. Some nausea without vomiting. Denies recent travel or sick contact. Has taken home COVID test Monday which was negative. 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   OSA on CPAP 06/17/2015   Morbid obesity with BMI of 50.0-59.9, adult (HCC) 06/17/2015   Bilateral chronic knee pain 06/17/2015    Allergies: No Known Allergies Medications:  Current Outpatient Medications:    oseltamivir (TAMIFLU) 75 MG capsule, Take 1 capsule (75 mg total) by mouth 2 (two) times daily for 5 days, Disp: 10 capsule, Rfl: 0   buPROPion (WELLBUTRIN XL) 150 MG 24 hr tablet, Take 1 tablet (150 mg total) by mouth every morning., Disp: 90 tablet, Rfl: 1   Cyanocobalamin (VITAMIN B12) 1000 MCG TBCR, 1 tablet, Disp: , Rfl:    escitalopram (LEXAPRO) 20  MG tablet, Take 1 tablet (20 mg total) by mouth daily., Disp: 90 tablet, Rfl: 1   levonorgestrel (MIRENA, 52 MG,) 20 MCG/DAY IUD, See admin instructions., Disp: , Rfl:    Multiple Vitamin (MULTIVITAMIN ADULT) TABS, Take 1 tablet by mouth daily., Disp: , Rfl:   Observations/Objective: Patient is well-developed, well-nourished in no acute distress.  Resting comfortably at home.  Head is normocephalic, atraumatic.  No labored  breathing. Speech is clear and coherent with logical content.  Patient is alert and oriented at baseline.   Assessment and Plan: 1. Flu-like symptoms - oseltamivir (TAMIFLU) 75 MG capsule; Take 1 capsule (75 mg total) by mouth 2 (two) times daily for 5 days  Dispense: 10 capsule; Refill: 0  Abrupt onset of classic flulike symptoms.  COVID testing negative.  We will have her repeat a home COVID test since its been 24 hours since initial test, just to be cautious.  She is to let me know of these results in case we need to make any adjustments to treatment.  For now we will treat empirically for influenza as we have had some positive cases in the area.  Supportive measures, OTC medications and vitamin recommendations reviewed with patient.  We will start Tamiflu twice daily for 5 days.  Strict in person follow-up precautions discussed.  Follow Up Instructions: I discussed the assessment and treatment plan with the patient. The patient was provided an opportunity to ask questions and all were answered. The patient agreed with the plan and demonstrated an understanding of the instructions.  A copy of instructions were sent to the patient via MyChart unless otherwise noted below.   The patient was advised to call back or seek an in-person evaluation if the symptoms worsen or if the condition fails to improve as anticipated.  Time:  I spent 10 minutes with the patient via telehealth technology discussing the above problems/concerns.    Laura Climes, PA-C

## 2022-06-18 ENCOUNTER — Other Ambulatory Visit (HOSPITAL_COMMUNITY): Payer: Self-pay

## 2022-07-16 ENCOUNTER — Other Ambulatory Visit (HOSPITAL_COMMUNITY): Payer: Self-pay

## 2022-08-11 ENCOUNTER — Other Ambulatory Visit (HOSPITAL_COMMUNITY): Payer: Self-pay

## 2022-08-11 MED ORDER — BUPROPION HCL ER (XL) 300 MG PO TB24
300.0000 mg | ORAL_TABLET | Freq: Every morning | ORAL | 0 refills | Status: AC
  Filled 2022-08-11: qty 45, 45d supply, fill #0

## 2022-08-11 MED ORDER — ESCITALOPRAM OXALATE 20 MG PO TABS
20.0000 mg | ORAL_TABLET | Freq: Every morning | ORAL | 0 refills | Status: AC
  Filled 2022-08-11: qty 45, 45d supply, fill #0

## 2022-08-12 ENCOUNTER — Other Ambulatory Visit (HOSPITAL_COMMUNITY): Payer: Self-pay

## 2022-08-12 MED ORDER — LAMOTRIGINE 25 & 50 & 100 MG PO KIT
25.0000 mg | PACK | ORAL | 0 refills | Status: AC
  Filled 2022-08-12: qty 35, 35d supply, fill #0

## 2022-08-13 ENCOUNTER — Other Ambulatory Visit (HOSPITAL_COMMUNITY): Payer: Self-pay

## 2022-09-02 IMAGING — MR MR ABDOMEN WO/W CM
21 series · 48 of 48 positions shown · IV contrast (multihance)
Comparison: MRI December 27, 2020

CLINICAL DATA: Follow-up hepatic lesions

EXAM:
MRI ABDOMEN WITHOUT AND WITH CONTRAST
TECHNIQUE: Multiplanar multisequence MR imaging of the abdomen was performed
both before and after the administration of intravenous contrast.
CONTRAST:  20mL MULTIHANCE GADOBENATE DIMEGLUMINE 529 MG/ML IV SOLN

[Series 4: T2 · coronal · 5.0mm · 1.56mm/px · 2 of 36 slices shown (1 of 3)]
[im 1/36]
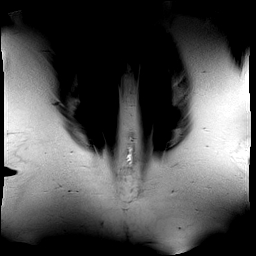
[im 36/36]
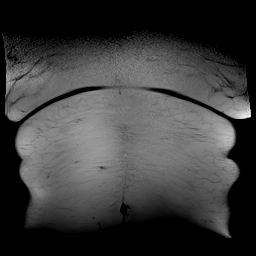

[Series 5: T1 · axial · 3.0mm · 1.19mm/px · z∈[-25,+188]mm · 6 of 144 slices shown]
[im 1/144]
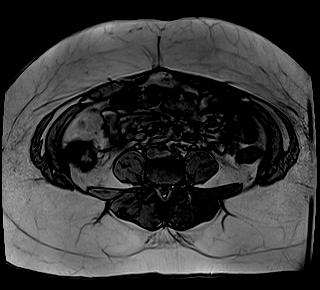
[im 29/144]
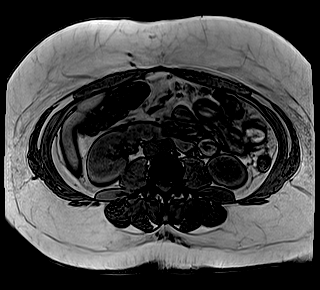
[im 58/144]
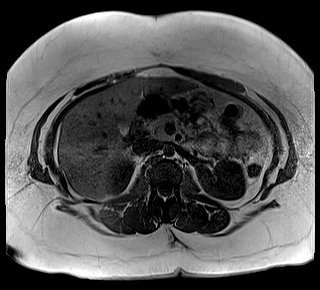
[im 86/144]
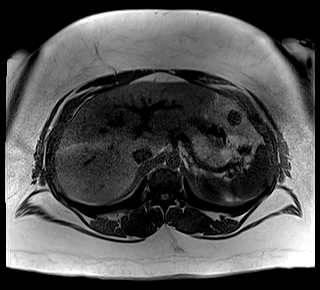
[im 115/144]
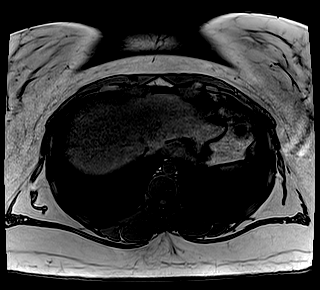
[im 144/144]
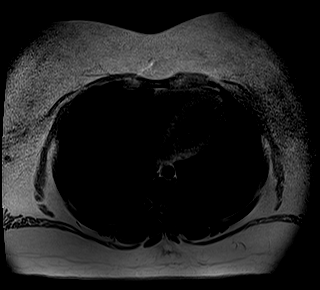

[Series 6: bSSFP · axial · 5.0mm · 1.25mm/px · z∈[-33,+189]mm · 2 of 38 slices shown]
[im 1/38]
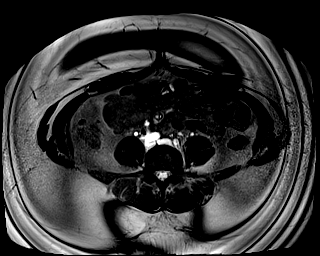
[im 38/38]
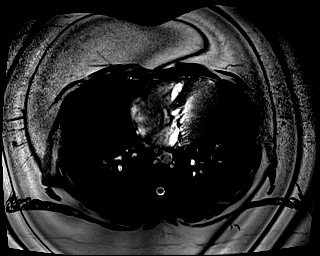

[Series 7: T2 · axial · 5.0mm · 1.48mm/px · z∈[+6,+228]mm · 2 of 38 slices shown (2 of 3)]
[im 1/38]
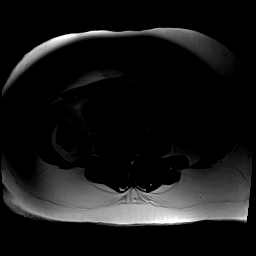
[im 38/38]
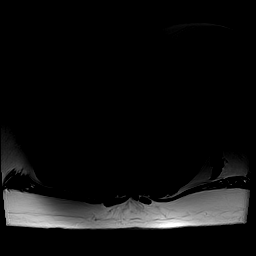

[Series 8: DWI · axial · 5.0mm · 1.42mm/px · z∈[+9,+219]mm · 5 of 108 slices shown (1 of 4)]
[im 1/108]
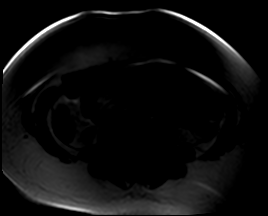
[im 27/108]
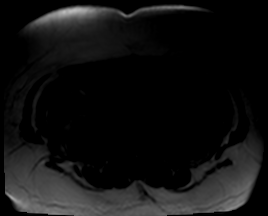
[im 54/108]
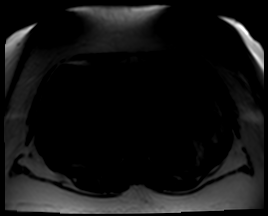
[im 81/108]
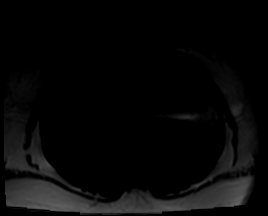
[im 108/108]
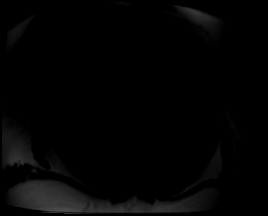

[Series 9: DWI · axial · 5.0mm · 1.42mm/px · 1 of 36 slices shown (2 of 4)]
[im 1/36]
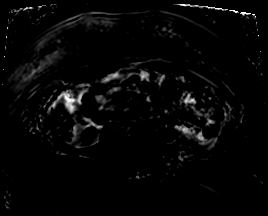

[Series 10: T2 · axial · 6.0mm · 1.19mm/px · 1 of 30 slices shown (3 of 3)]
[im 1/30]
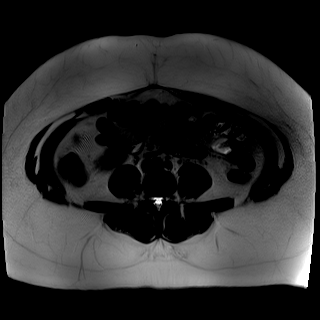

[Series 12: DWI · axial · 5.0mm · 1.42mm/px · z∈[-24,+222]mm · 4 of 126 slices shown (3 of 4)]
[im 1/126]
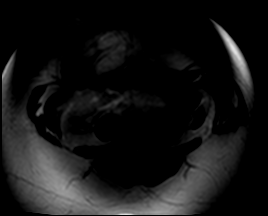
[im 42/126]
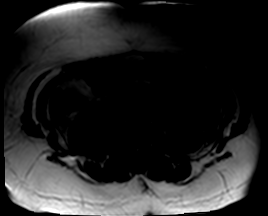
[im 84/126]
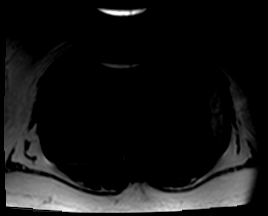
[im 126/126]
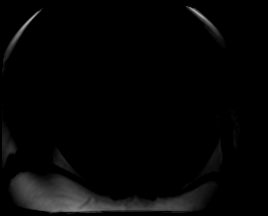

[Series 13: DWI · axial · 5.0mm · 1.42mm/px · 1 of 42 slices shown (4 of 4)]
[im 1/42]
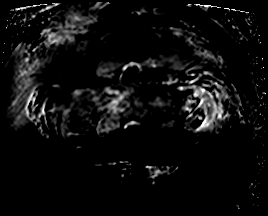

[Series 16: T1 dynamic · axial · non-contrast · 3.0mm · 1.25mm/px · z∈[-24,+189]mm · 2 of 72 slices shown (1 of 3)]
[im 1/72]
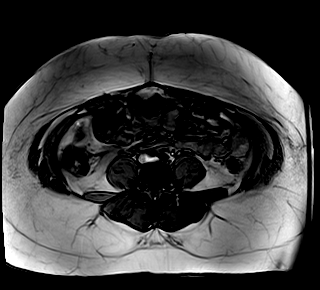
[im 72/72]
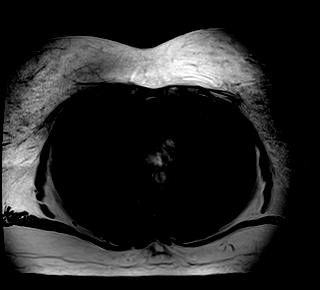

[Series 17: T1 dynamic post-contrast · axial · 3.0mm · 1.25mm/px · z∈[-24,+189]mm · 2 of 72 slices shown (1 of 9)]
[im 1/72]
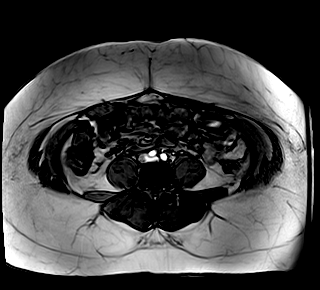
[im 72/72]
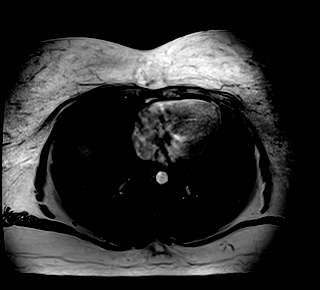

[Series 18: T1 dynamic post-contrast · axial · 3.0mm · 1.25mm/px · z∈[-24,+189]mm · 2 of 72 slices shown (2 of 9)]
[im 1/72]
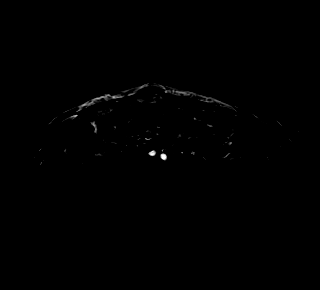
[im 72/72]
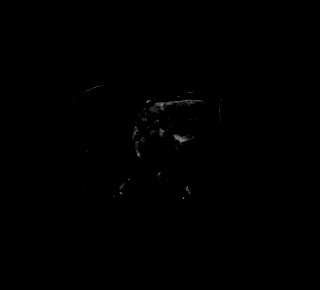

[Series 19: T1 dynamic · axial · non-contrast · 3.0mm · 1.25mm/px · z∈[-24,+189]mm · 2 of 72 slices shown (2 of 3)]
[im 1/72]
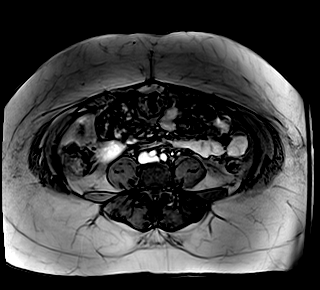
[im 72/72]
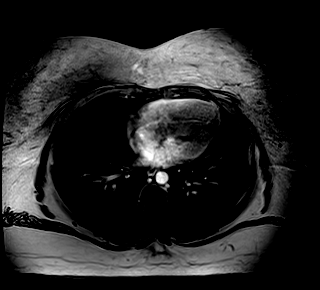

[Series 20: T1 dynamic · axial · non-contrast · 3.0mm · 1.25mm/px · z∈[-24,+189]mm · 2 of 72 slices shown (3 of 3)]
[im 1/72]
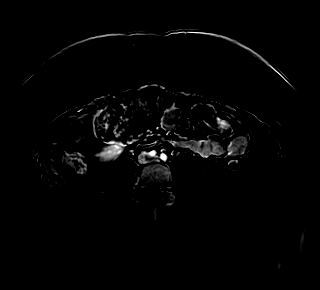
[im 72/72]
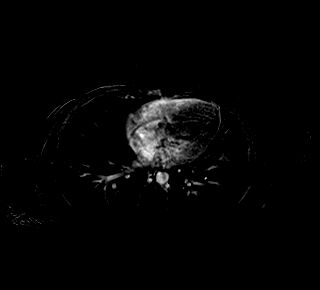

[Series 21: T1 dynamic post-contrast · axial · 3.0mm · 1.25mm/px · z∈[-24,+189]mm · 2 of 72 slices shown (3 of 9)]
[im 1/72]
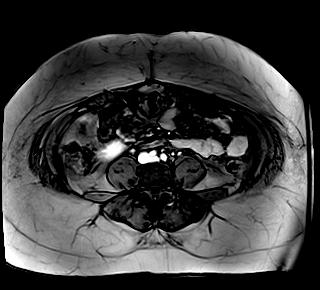
[im 72/72]
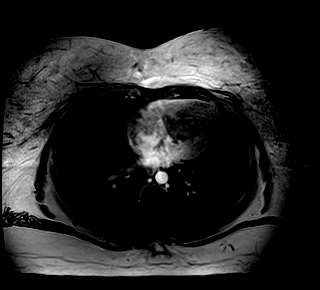

[Series 22: T1 dynamic post-contrast · axial · 3.0mm · 1.25mm/px · z∈[-24,+189]mm · 2 of 72 slices shown (4 of 9)]
[im 1/72]
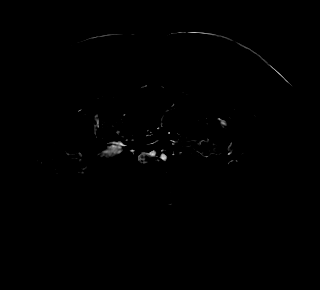
[im 72/72]
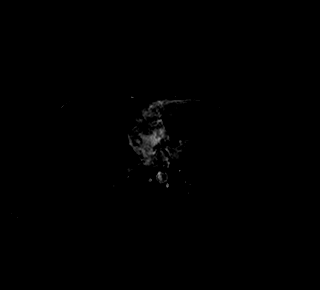

[Series 23: T1 dynamic post-contrast · axial · 3.0mm · 1.25mm/px · z∈[-24,+189]mm · 2 of 72 slices shown (5 of 9)]
[im 1/72]
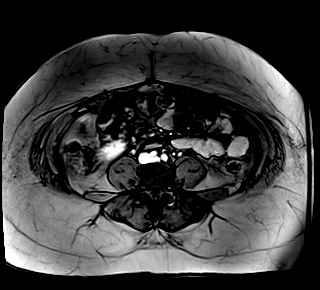
[im 72/72]
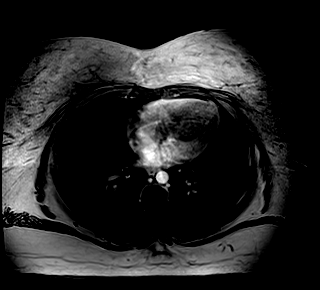

[Series 24: T1 dynamic post-contrast · axial · 3.0mm · 1.25mm/px · z∈[-24,+189]mm · 2 of 72 slices shown (6 of 9)]
[im 1/72]
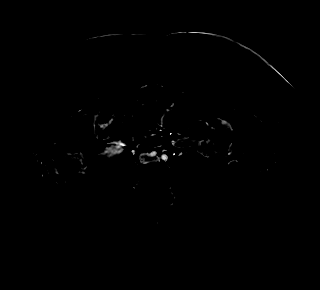
[im 72/72]
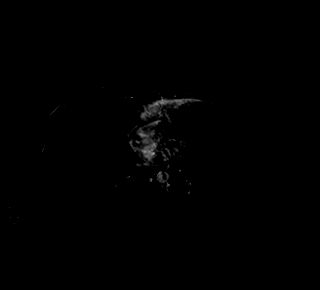

[Series 25: T1 dynamic post-contrast · coronal · 3.0mm · 1.25mm/px · 2 of 72 slices shown (7 of 9)]
[im 1/72]
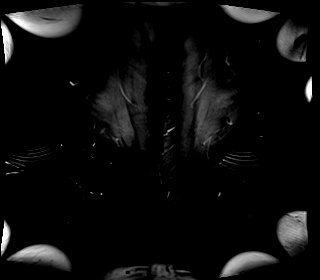
[im 72/72]
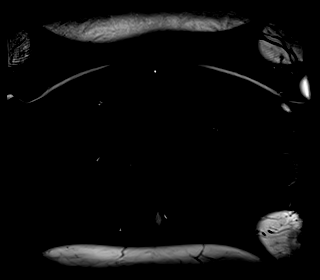

[Series 26: T1 dynamic post-contrast · axial · 3.0mm · 1.25mm/px · z∈[-24,+189]mm · 2 of 72 slices shown (8 of 9)]
[im 1/72]
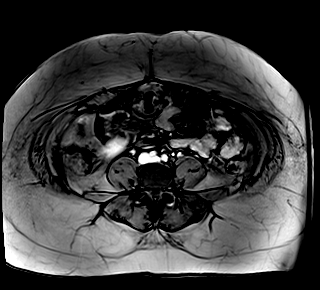
[im 72/72]
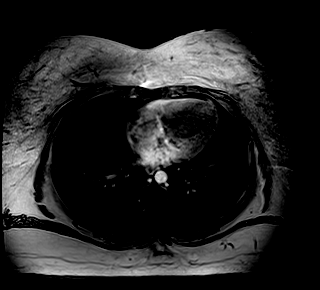

[Series 27: T1 dynamic post-contrast · axial · 3.0mm · 1.25mm/px · z∈[-24,+189]mm · 2 of 72 slices shown (9 of 9)]
[im 1/72]
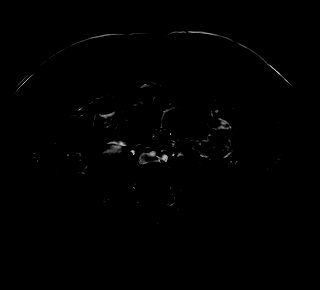
[im 72/72]
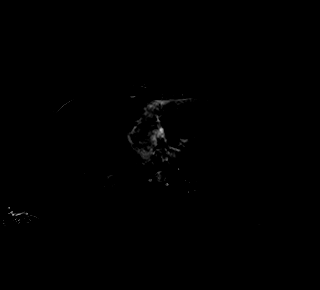

[48 of 48 positions shown; findings below may reference images not displayed]

FINDINGS: Lower chest: No acute abnormality.

Hepatobiliary: No hepatic steatosis.

Arterially enhancing lesion along the falciform ligament in segment
IV measures 2.8 cm on image 35/17, with mildly increased intrinsic
T2 signal and loss of signal on out of phase imaging on image 41/5,
unchanged in size from prior.

Stable arterially enhancing mildly T2 hyperintense segment IV B
hepatic lesion measuring 18 mm on image 25/17 which does not
demonstrate washout and is slightly hyperintense to background liver
on 3 minute delay.

There are additional hepatic lesion which are also arterially
enhancing with mild increased T2 signal, which in retrospect were
subtly evident on prior imaging but obscured by artifact. For
instance and 11 mm lesion in the hepatic dome on image [DATE] a 10 mm
lesion in segment V on image 29/17 and a 2.6 cm lesion in the
inferior right hepatic lobe on image 47/17, similar to the lesion
above these do not demonstrate washout and are slightly
hyperenhancing to background liver on 3 minute delayed imaging.

Gallbladder is unremarkable.  No biliary ductal dilation.

Pancreas: No mass, inflammatory changes, or other parenchymal
abnormality identified.

Spleen:  Within normal limits in size and appearance.

Adrenals/Urinary Tract: Bilateral adrenal glands are unremarkable.
No hydronephrosis. Bilateral renal cysts. No suspicious renal mass.

Stomach/Bowel: Visualized portions within the abdomen are
unremarkable.

Vascular/Lymphatic: No pathologically enlarged lymph nodes
identified. No abdominal aortic aneurysm demonstrated.

Other:  None.

Musculoskeletal: No suspicious bone lesions identified.
IMPRESSION: 1. Stable 1.8 cm segment IVb hepatic lesion. Additional bilobar
hepatic lesions with similar imaging characteristics, are better
seen on today's examination and further described above. These
demonstrate nonspecific imaging features but with primary
differential consideration focal nodular hyperplasia or adenomas.
Recommend follow-up hepatic protocol MRI of the abdomen with and
without EOVIST contrast in 6 months for more definitive
characterization and to ensure stability.
2. Stable 2.8 cm lesion along the falciform ligament which on
today's examination demonstrates arterial enhancement with loss of
signal on out of phase imaging, most likely reflecting a hepatic
adenoma. Attention on follow-up imaging suggested for more
definitive characterization and assessment of stability.

## 2022-09-03 ENCOUNTER — Other Ambulatory Visit (HOSPITAL_COMMUNITY): Payer: Self-pay

## 2022-09-03 MED ORDER — QUETIAPINE FUMARATE 25 MG PO TABS
25.0000 mg | ORAL_TABLET | Freq: Every day | ORAL | 0 refills | Status: DC
  Filled 2022-09-03: qty 30, 30d supply, fill #0

## 2022-09-07 ENCOUNTER — Other Ambulatory Visit (HOSPITAL_COMMUNITY): Payer: Self-pay

## 2022-09-07 MED ORDER — LAMOTRIGINE 150 MG PO TABS
150.0000 mg | ORAL_TABLET | Freq: Every day | ORAL | 0 refills | Status: DC
  Filled 2022-09-07: qty 30, 30d supply, fill #0

## 2022-09-20 ENCOUNTER — Other Ambulatory Visit (HOSPITAL_COMMUNITY): Payer: Self-pay

## 2022-09-20 MED ORDER — LAMOTRIGINE 150 MG PO TABS
150.0000 mg | ORAL_TABLET | Freq: Every day | ORAL | 0 refills | Status: AC
  Filled 2022-09-20 – 2022-10-04 (×2): qty 30, 30d supply, fill #0

## 2022-09-20 MED ORDER — QUETIAPINE FUMARATE 25 MG PO TABS
25.0000 mg | ORAL_TABLET | Freq: Every day | ORAL | 0 refills | Status: DC
  Filled 2022-09-20 – 2022-10-04 (×2): qty 30, 30d supply, fill #0

## 2022-09-27 ENCOUNTER — Other Ambulatory Visit (HOSPITAL_COMMUNITY): Payer: Self-pay

## 2022-09-27 MED ORDER — LAGEVRIO 200 MG PO CAPS
4.0000 | ORAL_CAPSULE | Freq: Two times a day (BID) | ORAL | 0 refills | Status: AC
  Filled 2022-09-27 (×2): qty 40, 5d supply, fill #0

## 2022-10-04 ENCOUNTER — Other Ambulatory Visit (HOSPITAL_COMMUNITY): Payer: Self-pay

## 2022-10-15 ENCOUNTER — Other Ambulatory Visit (HOSPITAL_COMMUNITY): Payer: Self-pay

## 2022-10-15 MED ORDER — LAMOTRIGINE 200 MG PO TABS
200.0000 mg | ORAL_TABLET | Freq: Every day | ORAL | 0 refills | Status: DC
  Filled 2022-10-15: qty 30, 30d supply, fill #0

## 2022-10-20 ENCOUNTER — Other Ambulatory Visit: Payer: Self-pay

## 2022-10-21 DIAGNOSIS — F3162 Bipolar disorder, current episode mixed, moderate: Secondary | ICD-10-CM | POA: Diagnosis not present

## 2022-10-28 DIAGNOSIS — Z01419 Encounter for gynecological examination (general) (routine) without abnormal findings: Secondary | ICD-10-CM | POA: Diagnosis not present

## 2022-10-28 DIAGNOSIS — Z6841 Body Mass Index (BMI) 40.0 and over, adult: Secondary | ICD-10-CM | POA: Diagnosis not present

## 2022-10-28 DIAGNOSIS — Z30431 Encounter for routine checking of intrauterine contraceptive device: Secondary | ICD-10-CM | POA: Diagnosis not present

## 2022-10-28 DIAGNOSIS — F39 Unspecified mood [affective] disorder: Secondary | ICD-10-CM | POA: Diagnosis not present

## 2022-10-28 DIAGNOSIS — Z9884 Bariatric surgery status: Secondary | ICD-10-CM | POA: Diagnosis not present

## 2022-11-04 DIAGNOSIS — F3162 Bipolar disorder, current episode mixed, moderate: Secondary | ICD-10-CM | POA: Diagnosis not present

## 2022-11-08 ENCOUNTER — Other Ambulatory Visit: Payer: Self-pay

## 2022-11-12 ENCOUNTER — Other Ambulatory Visit (HOSPITAL_COMMUNITY): Payer: Self-pay

## 2022-11-12 DIAGNOSIS — F3181 Bipolar II disorder: Secondary | ICD-10-CM | POA: Diagnosis not present

## 2022-11-12 MED ORDER — QUETIAPINE FUMARATE 25 MG PO TABS: 25.0000 mg | ORAL_TABLET | Freq: Every day | ORAL | 0 refills | Status: AC

## 2022-11-12 MED ORDER — QUETIAPINE FUMARATE 25 MG PO TABS
25.0000 mg | ORAL_TABLET | Freq: Every day | ORAL | 0 refills | Status: AC
  Filled 2022-11-12: qty 90, 90d supply, fill #0

## 2022-11-12 MED ORDER — LAMOTRIGINE 200 MG PO TABS
200.0000 mg | ORAL_TABLET | Freq: Every day | ORAL | 0 refills | Status: DC
  Filled 2022-11-12: qty 90, 90d supply, fill #0

## 2022-11-25 DIAGNOSIS — F3162 Bipolar disorder, current episode mixed, moderate: Secondary | ICD-10-CM | POA: Diagnosis not present

## 2022-12-16 DIAGNOSIS — F3162 Bipolar disorder, current episode mixed, moderate: Secondary | ICD-10-CM | POA: Diagnosis not present

## 2022-12-20 ENCOUNTER — Other Ambulatory Visit (HOSPITAL_COMMUNITY): Payer: Self-pay

## 2022-12-20 DIAGNOSIS — Z1389 Encounter for screening for other disorder: Secondary | ICD-10-CM | POA: Diagnosis not present

## 2022-12-20 DIAGNOSIS — F331 Major depressive disorder, recurrent, moderate: Secondary | ICD-10-CM | POA: Diagnosis not present

## 2022-12-20 DIAGNOSIS — Z903 Acquired absence of stomach [part of]: Secondary | ICD-10-CM | POA: Diagnosis not present

## 2022-12-20 DIAGNOSIS — F419 Anxiety disorder, unspecified: Secondary | ICD-10-CM | POA: Diagnosis not present

## 2022-12-20 DIAGNOSIS — R7309 Other abnormal glucose: Secondary | ICD-10-CM | POA: Diagnosis not present

## 2022-12-20 DIAGNOSIS — E8889 Other specified metabolic disorders: Secondary | ICD-10-CM | POA: Diagnosis not present

## 2022-12-20 DIAGNOSIS — Z6841 Body Mass Index (BMI) 40.0 and over, adult: Secondary | ICD-10-CM | POA: Diagnosis not present

## 2022-12-20 MED ORDER — PHENTERMINE HCL 37.5 MG PO TABS
18.7500 mg | ORAL_TABLET | Freq: Every day | ORAL | 0 refills | Status: AC
Start: 2022-12-20 — End: ?
  Filled 2022-12-20: qty 30, 30d supply, fill #0

## 2022-12-30 DIAGNOSIS — F3162 Bipolar disorder, current episode mixed, moderate: Secondary | ICD-10-CM | POA: Diagnosis not present

## 2023-01-03 DIAGNOSIS — Z903 Acquired absence of stomach [part of]: Secondary | ICD-10-CM | POA: Diagnosis not present

## 2023-01-03 DIAGNOSIS — F331 Major depressive disorder, recurrent, moderate: Secondary | ICD-10-CM | POA: Diagnosis not present

## 2023-01-03 DIAGNOSIS — Z6841 Body Mass Index (BMI) 40.0 and over, adult: Secondary | ICD-10-CM | POA: Diagnosis not present

## 2023-01-06 DIAGNOSIS — F3162 Bipolar disorder, current episode mixed, moderate: Secondary | ICD-10-CM | POA: Diagnosis not present

## 2023-01-13 DIAGNOSIS — F3162 Bipolar disorder, current episode mixed, moderate: Secondary | ICD-10-CM | POA: Diagnosis not present

## 2023-01-20 DIAGNOSIS — F3162 Bipolar disorder, current episode mixed, moderate: Secondary | ICD-10-CM | POA: Diagnosis not present

## 2023-01-27 DIAGNOSIS — F3162 Bipolar disorder, current episode mixed, moderate: Secondary | ICD-10-CM | POA: Diagnosis not present

## 2023-02-02 ENCOUNTER — Other Ambulatory Visit (HOSPITAL_COMMUNITY): Payer: Self-pay

## 2023-02-02 DIAGNOSIS — F3181 Bipolar II disorder: Secondary | ICD-10-CM | POA: Diagnosis not present

## 2023-02-02 MED ORDER — QUETIAPINE FUMARATE 25 MG PO TABS
25.0000 mg | ORAL_TABLET | Freq: Every day | ORAL | 0 refills | Status: AC
  Filled 2023-02-02: qty 90, 90d supply, fill #0

## 2023-02-02 MED ORDER — LAMOTRIGINE 200 MG PO TABS
200.0000 mg | ORAL_TABLET | Freq: Every day | ORAL | 0 refills | Status: DC
  Filled 2023-02-02: qty 90, 90d supply, fill #0

## 2023-02-03 ENCOUNTER — Other Ambulatory Visit (HOSPITAL_COMMUNITY): Payer: Self-pay

## 2023-02-04 DIAGNOSIS — F3162 Bipolar disorder, current episode mixed, moderate: Secondary | ICD-10-CM | POA: Diagnosis not present

## 2023-02-10 DIAGNOSIS — F3162 Bipolar disorder, current episode mixed, moderate: Secondary | ICD-10-CM | POA: Diagnosis not present

## 2023-02-17 DIAGNOSIS — F3162 Bipolar disorder, current episode mixed, moderate: Secondary | ICD-10-CM | POA: Diagnosis not present

## 2023-02-24 DIAGNOSIS — F3162 Bipolar disorder, current episode mixed, moderate: Secondary | ICD-10-CM | POA: Diagnosis not present

## 2023-03-01 DIAGNOSIS — F3162 Bipolar disorder, current episode mixed, moderate: Secondary | ICD-10-CM | POA: Diagnosis not present

## 2023-03-10 DIAGNOSIS — F3162 Bipolar disorder, current episode mixed, moderate: Secondary | ICD-10-CM | POA: Diagnosis not present

## 2023-03-17 DIAGNOSIS — F3162 Bipolar disorder, current episode mixed, moderate: Secondary | ICD-10-CM | POA: Diagnosis not present

## 2023-03-24 DIAGNOSIS — F3162 Bipolar disorder, current episode mixed, moderate: Secondary | ICD-10-CM | POA: Diagnosis not present

## 2023-03-31 DIAGNOSIS — F3162 Bipolar disorder, current episode mixed, moderate: Secondary | ICD-10-CM | POA: Diagnosis not present

## 2023-04-07 DIAGNOSIS — F3162 Bipolar disorder, current episode mixed, moderate: Secondary | ICD-10-CM | POA: Diagnosis not present

## 2023-04-12 DIAGNOSIS — F3162 Bipolar disorder, current episode mixed, moderate: Secondary | ICD-10-CM | POA: Diagnosis not present

## 2023-04-26 DIAGNOSIS — F3162 Bipolar disorder, current episode mixed, moderate: Secondary | ICD-10-CM | POA: Diagnosis not present

## 2023-05-03 ENCOUNTER — Other Ambulatory Visit (HOSPITAL_COMMUNITY): Payer: Self-pay

## 2023-05-03 DIAGNOSIS — Z6841 Body Mass Index (BMI) 40.0 and over, adult: Secondary | ICD-10-CM | POA: Diagnosis not present

## 2023-05-03 DIAGNOSIS — F331 Major depressive disorder, recurrent, moderate: Secondary | ICD-10-CM | POA: Diagnosis not present

## 2023-05-03 DIAGNOSIS — Z903 Acquired absence of stomach [part of]: Secondary | ICD-10-CM | POA: Diagnosis not present

## 2023-05-03 MED ORDER — METFORMIN HCL ER 500 MG PO TB24
500.0000 mg | ORAL_TABLET | Freq: Every evening | ORAL | 0 refills | Status: AC
  Filled 2023-05-03: qty 30, 30d supply, fill #0

## 2023-05-04 ENCOUNTER — Other Ambulatory Visit (HOSPITAL_COMMUNITY): Payer: Self-pay

## 2023-05-04 MED ORDER — LAMOTRIGINE 200 MG PO TABS
200.0000 mg | ORAL_TABLET | Freq: Every day | ORAL | 0 refills | Status: AC
  Filled 2023-05-04: qty 90, 90d supply, fill #0

## 2023-05-04 MED ORDER — QUETIAPINE FUMARATE 25 MG PO TABS
25.0000 mg | ORAL_TABLET | Freq: Every day | ORAL | 0 refills | Status: AC
  Filled 2023-05-04: qty 90, 90d supply, fill #0

## 2023-05-09 DIAGNOSIS — F3162 Bipolar disorder, current episode mixed, moderate: Secondary | ICD-10-CM | POA: Diagnosis not present

## 2023-05-16 DIAGNOSIS — F3162 Bipolar disorder, current episode mixed, moderate: Secondary | ICD-10-CM | POA: Diagnosis not present

## 2023-05-16 DIAGNOSIS — F39 Unspecified mood [affective] disorder: Secondary | ICD-10-CM | POA: Diagnosis not present

## 2023-05-16 DIAGNOSIS — Z0000 Encounter for general adult medical examination without abnormal findings: Secondary | ICD-10-CM | POA: Diagnosis not present

## 2023-05-16 DIAGNOSIS — Z903 Acquired absence of stomach [part of]: Secondary | ICD-10-CM | POA: Diagnosis not present

## 2023-05-31 DIAGNOSIS — F3162 Bipolar disorder, current episode mixed, moderate: Secondary | ICD-10-CM | POA: Diagnosis not present

## 2023-06-01 DIAGNOSIS — G4733 Obstructive sleep apnea (adult) (pediatric): Secondary | ICD-10-CM | POA: Diagnosis not present

## 2023-06-01 DIAGNOSIS — F39 Unspecified mood [affective] disorder: Secondary | ICD-10-CM | POA: Diagnosis not present

## 2023-06-07 DIAGNOSIS — F3162 Bipolar disorder, current episode mixed, moderate: Secondary | ICD-10-CM | POA: Diagnosis not present

## 2024-05-21 ENCOUNTER — Other Ambulatory Visit: Payer: Self-pay | Admitting: Family Medicine

## 2024-05-21 DIAGNOSIS — K769 Liver disease, unspecified: Secondary | ICD-10-CM

## 2024-06-08 ENCOUNTER — Other Ambulatory Visit

## 2024-08-10 ENCOUNTER — Other Ambulatory Visit

## 2024-11-23 ENCOUNTER — Other Ambulatory Visit: Payer: PRIVATE HEALTH INSURANCE
